# Patient Record
Sex: Male | Born: 1960 | Race: White | Hispanic: No | Marital: Single | Smoking: Never smoker
Health system: Southern US, Community
[De-identification: ages and names within clinical notes are randomized; demographics above are authoritative.]

## PROBLEM LIST (undated history)

## (undated) DIAGNOSIS — F32A Depression, unspecified: Secondary | ICD-10-CM

## (undated) DIAGNOSIS — S069X9A Unspecified intracranial injury with loss of consciousness of unspecified duration, initial encounter: Secondary | ICD-10-CM

## (undated) DIAGNOSIS — F0390 Unspecified dementia without behavioral disturbance: Secondary | ICD-10-CM

## (undated) DIAGNOSIS — F329 Major depressive disorder, single episode, unspecified: Secondary | ICD-10-CM

## (undated) DIAGNOSIS — S069XAA Unspecified intracranial injury with loss of consciousness status unknown, initial encounter: Secondary | ICD-10-CM

---

## 2006-08-08 ENCOUNTER — Emergency Department: Payer: Self-pay | Admitting: Emergency Medicine

## 2007-06-21 ENCOUNTER — Emergency Department: Payer: Self-pay | Admitting: Emergency Medicine

## 2007-11-22 ENCOUNTER — Observation Stay: Payer: Self-pay | Admitting: Unknown Physician Specialty

## 2008-11-12 ENCOUNTER — Emergency Department: Payer: Self-pay | Admitting: Emergency Medicine

## 2008-12-12 ENCOUNTER — Emergency Department: Payer: Self-pay | Admitting: Emergency Medicine

## 2009-03-26 ENCOUNTER — Emergency Department: Payer: Self-pay | Admitting: Emergency Medicine

## 2009-05-03 ENCOUNTER — Emergency Department: Payer: Self-pay | Admitting: Emergency Medicine

## 2010-01-04 ENCOUNTER — Emergency Department: Payer: Self-pay | Admitting: Emergency Medicine

## 2010-01-06 ENCOUNTER — Emergency Department: Payer: Self-pay | Admitting: Emergency Medicine

## 2010-03-23 ENCOUNTER — Inpatient Hospital Stay: Payer: Self-pay | Admitting: Unknown Physician Specialty

## 2010-05-22 ENCOUNTER — Emergency Department: Payer: Self-pay | Admitting: Emergency Medicine

## 2010-06-12 ENCOUNTER — Inpatient Hospital Stay: Payer: Self-pay | Admitting: Psychiatry

## 2010-07-22 ENCOUNTER — Emergency Department: Payer: Self-pay | Admitting: Emergency Medicine

## 2011-03-15 ENCOUNTER — Ambulatory Visit: Payer: Self-pay | Admitting: Family Medicine

## 2011-04-22 ENCOUNTER — Emergency Department (HOSPITAL_COMMUNITY)
Admission: EM | Admit: 2011-04-22 | Discharge: 2011-04-23 | Disposition: A | Discharge status: Home / Self Care | Payer: Medicaid Other | Attending: Emergency Medicine | Admitting: Emergency Medicine

## 2011-04-22 DIAGNOSIS — T490X1A Poisoning by local antifungal, anti-infective and anti-inflammatory drugs, accidental (unintentional), initial encounter: Secondary | ICD-10-CM | POA: Insufficient documentation

## 2011-04-22 DIAGNOSIS — Y9210 Unspecified residential institution as the place of occurrence of the external cause: Secondary | ICD-10-CM | POA: Insufficient documentation

## 2011-04-22 DIAGNOSIS — Z79899 Other long term (current) drug therapy: Secondary | ICD-10-CM | POA: Insufficient documentation

## 2011-04-22 DIAGNOSIS — T495X1A Poisoning by ophthalmological drugs and preparations, accidental (unintentional), initial encounter: Secondary | ICD-10-CM | POA: Insufficient documentation

## 2011-04-23 LAB — DIFFERENTIAL
Basophils Absolute: 0 10*3/uL (ref 0.0–0.1)
Basophils Relative: 0 % (ref 0–1)
Eosinophils Absolute: 0.1 10*3/uL (ref 0.0–0.7)
Eosinophils Relative: 1 % (ref 0–5)
Lymphocytes Relative: 59 % — ABNORMAL HIGH (ref 12–46)
Monocytes Absolute: 0.6 10*3/uL (ref 0.1–1.0)

## 2011-04-23 LAB — BASIC METABOLIC PANEL
BUN: 7 mg/dL (ref 6–23)
CO2: 28 mEq/L (ref 19–32)
Calcium: 8.8 mg/dL (ref 8.4–10.5)
Chloride: 105 mEq/L (ref 96–112)
Creatinine, Ser: 0.65 mg/dL (ref 0.4–1.5)
GFR calc Af Amer: >60 mL/min (ref >60)
GFR calc non Af Amer: >60 mL/min (ref >60)
Glucose, Bld: 88 mg/dL (ref 70–99)
Potassium: 3.4 mEq/L — ABNORMAL LOW (ref 3.5–5.1)
Sodium: 137 mEq/L (ref 135–145)

## 2011-04-23 LAB — CBC
HCT: 32.3 % — ABNORMAL LOW (ref 39.0–52.0)
MCHC: 34.4 g/dL (ref 30.0–36.0)
Platelets: 169 10*3/uL (ref 150–400)
RDW: 13.5 % (ref 11.5–15.5)
WBC: 7.1 10*3/uL (ref 4.0–10.5)

## 2011-04-23 LAB — ETHANOL: Alcohol, Ethyl (B): <5 mg/dL (ref 0–10)

## 2011-12-28 ENCOUNTER — Ambulatory Visit: Payer: Self-pay | Admitting: Internal Medicine

## 2011-12-30 ENCOUNTER — Inpatient Hospital Stay: Payer: Self-pay | Admitting: Internal Medicine

## 2011-12-30 LAB — COMPREHENSIVE METABOLIC PANEL
Albumin: 3.5 g/dL (ref 3.4–5.0)
Alkaline Phosphatase: 115 U/L (ref 50–136)
BUN: 35 mg/dL — ABNORMAL HIGH (ref 7–18)
Bilirubin,Total: 0.4 mg/dL (ref 0.2–1.0)
Creatinine: 0.86 mg/dL (ref 0.60–1.30)
Glucose: 108 mg/dL — ABNORMAL HIGH (ref 65–99)
Osmolality: 308 (ref 275–301)
SGPT (ALT): 28 U/L
Sodium: 151 mmol/L — ABNORMAL HIGH (ref 136–145)

## 2011-12-30 LAB — LIPASE, BLOOD: Lipase: 98 U/L (ref 73–393)

## 2011-12-30 LAB — CBC
HCT: 40.6 % (ref 40.0–52.0)
Platelet: 114 10*3/uL — ABNORMAL LOW (ref 150–440)
RDW: 15.8 % — ABNORMAL HIGH (ref 11.5–14.5)
WBC: 5.1 10*3/uL (ref 3.8–10.6)

## 2011-12-31 LAB — CBC WITH DIFFERENTIAL/PLATELET
Basophil #: 0 10*3/uL (ref 0.0–0.1)
Eosinophil #: 0 10*3/uL (ref 0.0–0.7)
HCT: 39.4 % — ABNORMAL LOW (ref 40.0–52.0)
HGB: 13 g/dL (ref 13.0–18.0)
Lymphocyte %: 23.6 %
MCHC: 33 g/dL (ref 32.0–36.0)
Monocyte %: 8.6 %
Neutrophil #: 3.7 10*3/uL (ref 1.4–6.5)
Neutrophil %: 67.1 %
RDW: 15.5 % — ABNORMAL HIGH (ref 11.5–14.5)
WBC: 5.6 10*3/uL (ref 3.8–10.6)

## 2011-12-31 LAB — FOLATE: Folic Acid: 11.6 ng/mL (ref 3.1–100.0)

## 2011-12-31 LAB — BASIC METABOLIC PANEL
Creatinine: 0.73 mg/dL (ref 0.60–1.30)
EGFR (African American): >60
EGFR (Non-African Amer.): >60
Glucose: 77 mg/dL (ref 65–99)
Sodium: 149 mmol/L — ABNORMAL HIGH (ref 136–145)

## 2011-12-31 LAB — WBCS, STOOL

## 2011-12-31 LAB — OCCULT BLOOD X 1 CARD TO LAB, STOOL: Occult Blood, Feces: NEGATIVE

## 2012-01-01 LAB — CBC WITH DIFFERENTIAL/PLATELET
Basophil #: 0 10*3/uL (ref 0.0–0.1)
Eosinophil #: 0 10*3/uL (ref 0.0–0.7)
HCT: 34.7 % — ABNORMAL LOW (ref 40.0–52.0)
Lymphocyte #: 1.5 10*3/uL (ref 1.0–3.6)
Lymphocyte %: 26.8 %
MCHC: 33.4 g/dL (ref 32.0–36.0)
Monocyte %: 7.6 %
Neutrophil #: 3.6 10*3/uL (ref 1.4–6.5)
Platelet: 114 10*3/uL — ABNORMAL LOW (ref 150–440)
RDW: 15.5 % — ABNORMAL HIGH (ref 11.5–14.5)
WBC: 5.6 10*3/uL (ref 3.8–10.6)

## 2012-01-01 LAB — BASIC METABOLIC PANEL
Anion Gap: 9 (ref 7–16)
BUN: 22 mg/dL — ABNORMAL HIGH (ref 7–18)
Calcium, Total: 8.6 mg/dL (ref 8.5–10.1)
Co2: 31 mmol/L (ref 21–32)
Creatinine: 0.77 mg/dL (ref 0.60–1.30)
EGFR (African American): >60
Glucose: 94 mg/dL (ref 65–99)
Osmolality: 294 (ref 275–301)

## 2012-01-02 LAB — CBC WITH DIFFERENTIAL/PLATELET
Basophil #: 0 10*3/uL (ref 0.0–0.1)
Eosinophil #: 0 10*3/uL (ref 0.0–0.7)
HGB: 11.5 g/dL — ABNORMAL LOW (ref 13.0–18.0)
Lymphocyte #: 1.9 10*3/uL (ref 1.0–3.6)
Lymphocyte %: 53.8 %
MCV: 90 fL (ref 80–100)
Monocyte %: 6 %
Platelet: 105 10*3/uL — ABNORMAL LOW (ref 150–440)
RBC: 3.78 10*6/uL — ABNORMAL LOW (ref 4.40–5.90)
RDW: 15.3 % — ABNORMAL HIGH (ref 11.5–14.5)
WBC: 3.6 10*3/uL — ABNORMAL LOW (ref 3.8–10.6)

## 2012-01-02 LAB — BASIC METABOLIC PANEL
Anion Gap: 9 (ref 7–16)
Calcium, Total: 8.6 mg/dL (ref 8.5–10.1)
Co2: 32 mmol/L (ref 21–32)
EGFR (African American): >60
Glucose: 79 mg/dL (ref 65–99)
Osmolality: 282 (ref 275–301)
Sodium: 141 mmol/L (ref 136–145)

## 2012-01-02 LAB — STOOL CULTURE

## 2012-01-03 LAB — CBC WITH DIFFERENTIAL/PLATELET
Basophil %: 0.3 %
Eosinophil #: 0 10*3/uL (ref 0.0–0.7)
Lymphocyte #: 1.5 10*3/uL (ref 1.0–3.6)
MCH: 30.5 pg (ref 26.0–34.0)
MCHC: 33.8 g/dL (ref 32.0–36.0)
MCV: 90 fL (ref 80–100)
Monocyte #: 0.3 10*3/uL (ref 0.0–0.7)
Neutrophil #: 4 10*3/uL (ref 1.4–6.5)
Neutrophil %: 68.6 %
RBC: 4.09 10*6/uL — ABNORMAL LOW (ref 4.40–5.90)

## 2012-01-03 LAB — URINALYSIS, COMPLETE
Ketone: NEGATIVE
Ph: 7 (ref 4.5–8.0)
Protein: NEGATIVE
Specific Gravity: 1.005 (ref 1.003–1.030)
WBC UR: NONE SEEN /HPF (ref 0–5)

## 2012-01-03 LAB — BASIC METABOLIC PANEL
Chloride: 99 mmol/L (ref 98–107)
Co2: 30 mmol/L (ref 21–32)
Creatinine: 0.76 mg/dL (ref 0.60–1.30)
EGFR (African American): >60
Sodium: 135 mmol/L — ABNORMAL LOW (ref 136–145)

## 2012-01-04 LAB — CBC WITH DIFFERENTIAL/PLATELET
Basophil #: 0 10*3/uL (ref 0.0–0.1)
Basophil %: 0.3 %
Eosinophil %: 0.4 %
HCT: 38.2 % — ABNORMAL LOW (ref 40.0–52.0)
Lymphocyte #: 1.9 10*3/uL (ref 1.0–3.6)
Lymphocyte %: 37 %
Monocyte #: 0.4 10*3/uL (ref 0.0–0.7)
Neutrophil #: 2.9 10*3/uL (ref 1.4–6.5)
Neutrophil %: 54.6 %
RDW: 15.5 % — ABNORMAL HIGH (ref 11.5–14.5)
WBC: 5.3 10*3/uL (ref 3.8–10.6)

## 2012-01-04 LAB — MAGNESIUM: Magnesium: 1.8 mg/dL

## 2012-01-04 LAB — BASIC METABOLIC PANEL
Calcium, Total: 8.5 mg/dL (ref 8.5–10.1)
Co2: 28 mmol/L (ref 21–32)
Creatinine: 0.58 mg/dL — ABNORMAL LOW (ref 0.60–1.30)
EGFR (African American): >60
Potassium: 4.5 mmol/L (ref 3.5–5.1)
Sodium: 139 mmol/L (ref 136–145)

## 2012-01-05 LAB — CBC WITH DIFFERENTIAL/PLATELET
Basophil #: 0 10*3/uL (ref 0.0–0.1)
Eosinophil %: 1 %
HGB: 13.9 g/dL (ref 13.0–18.0)
Lymphocyte %: 24.3 %
Monocyte %: 5.5 %
Neutrophil %: 68.9 %
Platelet: 152 10*3/uL (ref 150–440)
RBC: 4.58 10*6/uL (ref 4.40–5.90)
WBC: 11.6 10*3/uL — ABNORMAL HIGH (ref 3.8–10.6)

## 2012-01-05 LAB — BASIC METABOLIC PANEL
Calcium, Total: 8.9 mg/dL (ref 8.5–10.1)
Co2: 28 mmol/L (ref 21–32)
Creatinine: 0.72 mg/dL (ref 0.60–1.30)
EGFR (African American): >60
Potassium: 4.3 mmol/L (ref 3.5–5.1)
Sodium: 138 mmol/L (ref 136–145)

## 2012-01-06 LAB — CBC WITH DIFFERENTIAL/PLATELET
Basophil #: 0 10*3/uL (ref 0.0–0.1)
Eosinophil %: 1.1 %
HCT: 36.4 % — ABNORMAL LOW (ref 40.0–52.0)
HGB: 12 g/dL — ABNORMAL LOW (ref 13.0–18.0)
Lymphocyte #: 2.1 10*3/uL (ref 1.0–3.6)
Lymphocyte %: 22.2 %
MCH: 30.3 pg (ref 26.0–34.0)
MCHC: 33 g/dL (ref 32.0–36.0)
Monocyte %: 6.1 %
RBC: 3.97 10*6/uL — ABNORMAL LOW (ref 4.40–5.90)

## 2012-01-06 LAB — BASIC METABOLIC PANEL
BUN: 17 mg/dL (ref 7–18)
Chloride: 101 mmol/L (ref 98–107)
Osmolality: 280 (ref 275–301)
Potassium: 4.3 mmol/L (ref 3.5–5.1)

## 2012-01-07 LAB — CBC WITH DIFFERENTIAL/PLATELET
Basophil %: 0.3 %
Eosinophil %: 1.2 %
HGB: 11.9 g/dL — ABNORMAL LOW (ref 13.0–18.0)
Lymphocyte #: 2 10*3/uL (ref 1.0–3.6)
MCH: 30.3 pg (ref 26.0–34.0)
MCHC: 33.3 g/dL (ref 32.0–36.0)
MCV: 91 fL (ref 80–100)
Neutrophil #: 9.1 10*3/uL — ABNORMAL HIGH (ref 1.4–6.5)
Neutrophil %: 76 %

## 2012-01-07 LAB — BASIC METABOLIC PANEL
BUN: 16 mg/dL (ref 7–18)
Calcium, Total: 8.9 mg/dL (ref 8.5–10.1)
Creatinine: 0.76 mg/dL (ref 0.60–1.30)
EGFR (African American): >60
EGFR (Non-African Amer.): >60
Glucose: 93 mg/dL (ref 65–99)
Potassium: 4.4 mmol/L (ref 3.5–5.1)
Sodium: 141 mmol/L (ref 136–145)

## 2012-01-08 LAB — CBC WITH DIFFERENTIAL/PLATELET
Basophil %: 0.9 %
Eosinophil %: 3.5 %
HGB: 10.9 g/dL — ABNORMAL LOW (ref 13.0–18.0)
Lymphocyte #: 2 10*3/uL (ref 1.0–3.6)
Lymphocyte %: 39.9 %
MCHC: 33.3 g/dL (ref 32.0–36.0)
Monocyte %: 8.4 %
Neutrophil #: 2.4 10*3/uL (ref 1.4–6.5)
Neutrophil %: 47.3 %
Platelet: 162 10*3/uL (ref 150–440)
RBC: 3.58 10*6/uL — ABNORMAL LOW (ref 4.40–5.90)

## 2012-01-08 LAB — BASIC METABOLIC PANEL
Calcium, Total: 8.5 mg/dL (ref 8.5–10.1)
Co2: 26 mmol/L (ref 21–32)
Creatinine: 0.75 mg/dL (ref 0.60–1.30)
Potassium: 4.2 mmol/L (ref 3.5–5.1)
Sodium: 137 mmol/L (ref 136–145)

## 2012-01-09 LAB — BASIC METABOLIC PANEL
Anion Gap: 9 (ref 7–16)
Calcium, Total: 8.3 mg/dL — ABNORMAL LOW (ref 8.5–10.1)
Chloride: 103 mmol/L (ref 98–107)
Co2: 28 mmol/L (ref 21–32)
EGFR (African American): >60
EGFR (Non-African Amer.): >60
Osmolality: 277 (ref 275–301)
Potassium: 4.5 mmol/L (ref 3.5–5.1)

## 2012-01-09 LAB — CBC WITH DIFFERENTIAL/PLATELET
Basophil #: 0 10*3/uL (ref 0.0–0.1)
Eosinophil #: 0.2 10*3/uL (ref 0.0–0.7)
Lymphocyte %: 51 %
MCHC: 33.1 g/dL (ref 32.0–36.0)
Monocyte %: 8.4 %
Neutrophil %: 35.5 %
Platelet: 180 10*3/uL (ref 150–440)
RBC: 3.42 10*6/uL — ABNORMAL LOW (ref 4.40–5.90)
RDW: 15.8 % — ABNORMAL HIGH (ref 11.5–14.5)
WBC: 4.5 10*3/uL (ref 3.8–10.6)

## 2012-01-11 LAB — BASIC METABOLIC PANEL
Chloride: 103 mmol/L (ref 98–107)
Co2: 28 mmol/L (ref 21–32)
Creatinine: 0.51 mg/dL — ABNORMAL LOW (ref 0.60–1.30)
Sodium: 137 mmol/L (ref 136–145)

## 2012-01-11 LAB — CBC WITH DIFFERENTIAL/PLATELET
Basophil #: 0 10*3/uL (ref 0.0–0.1)
Basophil %: 1.1 %
Eosinophil #: 0.1 10*3/uL (ref 0.0–0.7)
HCT: 32.5 % — ABNORMAL LOW (ref 40.0–52.0)
HGB: 10.8 g/dL — ABNORMAL LOW (ref 13.0–18.0)
Lymphocyte #: 1.6 10*3/uL (ref 1.0–3.6)
Lymphocyte %: 47.5 %
MCHC: 33.1 g/dL (ref 32.0–36.0)
Monocyte #: 0.3 10*3/uL (ref 0.0–0.7)
Neutrophil #: 1.3 10*3/uL — ABNORMAL LOW (ref 1.4–6.5)
Neutrophil %: 40.4 %
RBC: 3.5 10*6/uL — ABNORMAL LOW (ref 4.40–5.90)
WBC: 3.3 10*3/uL — ABNORMAL LOW (ref 3.8–10.6)

## 2012-01-12 LAB — URINALYSIS, COMPLETE
Bacteria: NONE SEEN
Bilirubin,UR: NEGATIVE
Glucose,UR: NEGATIVE mg/dL (ref 0–75)
Ph: 6 (ref 4.5–8.0)
Specific Gravity: 1.003 (ref 1.003–1.030)
Squamous Epithelial: NONE SEEN

## 2012-01-12 LAB — BASIC METABOLIC PANEL
BUN: 7 mg/dL (ref 7–18)
Calcium, Total: 8.4 mg/dL — ABNORMAL LOW (ref 8.5–10.1)
Chloride: 106 mmol/L (ref 98–107)
Osmolality: 286 (ref 275–301)
Potassium: 4.7 mmol/L (ref 3.5–5.1)
Sodium: 145 mmol/L (ref 136–145)

## 2012-01-12 LAB — CBC WITH DIFFERENTIAL/PLATELET
Basophil #: 0 10*3/uL (ref 0.0–0.1)
Basophil %: 0.6 %
Eosinophil #: 0 10*3/uL (ref 0.0–0.7)
HCT: 30.9 % — ABNORMAL LOW (ref 40.0–52.0)
MCH: 30.3 pg (ref 26.0–34.0)
MCV: 91 fL (ref 80–100)
Monocyte #: 0.1 10*3/uL (ref 0.0–0.7)
Platelet: 223 10*3/uL (ref 150–440)
RDW: 16.3 % — ABNORMAL HIGH (ref 11.5–14.5)

## 2012-01-12 LAB — TSH: Thyroid Stimulating Horm: 3.6 u[IU]/mL

## 2012-01-13 LAB — COMPREHENSIVE METABOLIC PANEL
Albumin: 2.5 g/dL — ABNORMAL LOW (ref 3.4–5.0)
Alkaline Phosphatase: 65 U/L (ref 50–136)
BUN: 5 mg/dL — ABNORMAL LOW (ref 7–18)
Chloride: 102 mmol/L (ref 98–107)
EGFR (African American): >60
EGFR (Non-African Amer.): >60
Glucose: 119 mg/dL — ABNORMAL HIGH (ref 65–99)
Osmolality: 283 (ref 275–301)
SGOT(AST): 15 U/L (ref 15–37)
SGPT (ALT): 16 U/L
Total Protein: 5.4 g/dL — ABNORMAL LOW (ref 6.4–8.2)

## 2012-01-13 LAB — CBC WITH DIFFERENTIAL/PLATELET
Basophil %: 0.3 %
Eosinophil %: 0.1 %
HCT: 31.7 % — ABNORMAL LOW (ref 40.0–52.0)
HGB: 10.6 g/dL — ABNORMAL LOW (ref 13.0–18.0)
Lymphocyte #: 1.6 10*3/uL (ref 1.0–3.6)
MCV: 92 fL (ref 80–100)
Monocyte %: 5.4 %
Neutrophil #: 8.4 10*3/uL — ABNORMAL HIGH (ref 1.4–6.5)
Platelet: 299 10*3/uL (ref 150–440)
RBC: 3.46 10*6/uL — ABNORMAL LOW (ref 4.40–5.90)
RDW: 16.8 % — ABNORMAL HIGH (ref 11.5–14.5)
WBC: 10.7 10*3/uL — ABNORMAL HIGH (ref 3.8–10.6)

## 2012-01-13 LAB — MAGNESIUM: Magnesium: 1.4 mg/dL — ABNORMAL LOW

## 2012-01-14 LAB — BASIC METABOLIC PANEL
Anion Gap: 9 (ref 7–16)
BUN: 5 mg/dL — ABNORMAL LOW (ref 7–18)
Calcium, Total: 8.5 mg/dL (ref 8.5–10.1)
Chloride: 105 mmol/L (ref 98–107)
Co2: 32 mmol/L (ref 21–32)
Creatinine: 0.44 mg/dL — ABNORMAL LOW (ref 0.60–1.30)
EGFR (African American): >60
EGFR (Non-African Amer.): >60
Glucose: 129 mg/dL — ABNORMAL HIGH (ref 65–99)
Potassium: 3.6 mmol/L (ref 3.5–5.1)

## 2012-01-14 LAB — CBC WITH DIFFERENTIAL/PLATELET
Basophil #: 0 10*3/uL (ref 0.0–0.1)
Basophil %: 0.6 %
Eosinophil #: 0.1 10*3/uL (ref 0.0–0.7)
HCT: 35.4 % — ABNORMAL LOW (ref 40.0–52.0)
HGB: 11.8 g/dL — ABNORMAL LOW (ref 13.0–18.0)
Lymphocyte #: 1.6 10*3/uL (ref 1.0–3.6)
Lymphocyte %: 35.4 %
MCHC: 33.3 g/dL (ref 32.0–36.0)
Monocyte %: 8.1 %
Neutrophil #: 2.5 10*3/uL (ref 1.4–6.5)
Platelet: 259 10*3/uL (ref 150–440)
RDW: 16.8 % — ABNORMAL HIGH (ref 11.5–14.5)

## 2012-01-15 LAB — CBC WITH DIFFERENTIAL/PLATELET
Basophil #: 0.1 10*3/uL (ref 0.0–0.1)
Basophil %: 1.7 %
Eosinophil #: 0 10*3/uL (ref 0.0–0.7)
HCT: 30.8 % — ABNORMAL LOW (ref 40.0–52.0)
Lymphocyte #: 2.4 10*3/uL (ref 1.0–3.6)
Lymphocyte %: 32.6 %
MCH: 30.5 pg (ref 26.0–34.0)
MCV: 92 fL (ref 80–100)
Monocyte #: 0.6 10*3/uL (ref 0.0–0.7)
Neutrophil #: 4.1 10*3/uL (ref 1.4–6.5)
Neutrophil %: 56.7 %
RDW: 17.2 % — ABNORMAL HIGH (ref 11.5–14.5)
WBC: 7.3 10*3/uL (ref 3.8–10.6)

## 2012-01-15 LAB — BASIC METABOLIC PANEL
BUN: 6 mg/dL — ABNORMAL LOW (ref 7–18)
Chloride: 99 mmol/L (ref 98–107)
Creatinine: 0.61 mg/dL (ref 0.60–1.30)
EGFR (African American): >60
EGFR (Non-African Amer.): >60
Glucose: 126 mg/dL — ABNORMAL HIGH (ref 65–99)
Sodium: 140 mmol/L (ref 136–145)

## 2012-01-16 LAB — BASIC METABOLIC PANEL
Anion Gap: 8 (ref 7–16)
BUN: 6 mg/dL — ABNORMAL LOW (ref 7–18)
Chloride: 103 mmol/L (ref 98–107)
Co2: 32 mmol/L (ref 21–32)
Creatinine: 0.52 mg/dL — ABNORMAL LOW (ref 0.60–1.30)
EGFR (African American): >60
Potassium: 3.9 mmol/L (ref 3.5–5.1)
Sodium: 143 mmol/L (ref 136–145)

## 2012-01-16 LAB — CBC WITH DIFFERENTIAL/PLATELET
Basophil #: 0 10*3/uL (ref 0.0–0.1)
HCT: 29.9 % — ABNORMAL LOW (ref 40.0–52.0)
Lymphocyte #: 2.2 10*3/uL (ref 1.0–3.6)
Lymphocyte %: 50.1 %
Monocyte %: 10.4 %
Neutrophil %: 38.5 %
Platelet: 217 10*3/uL (ref 150–440)
RBC: 3.25 10*6/uL — ABNORMAL LOW (ref 4.40–5.90)
RDW: 16.4 % — ABNORMAL HIGH (ref 11.5–14.5)
WBC: 4.3 10*3/uL (ref 3.8–10.6)

## 2012-01-17 LAB — CULTURE, BLOOD (SINGLE)

## 2012-01-17 LAB — MAGNESIUM: Magnesium: 1.8 mg/dL

## 2012-01-18 LAB — CBC WITH DIFFERENTIAL/PLATELET
Eosinophil #: 0 10*3/uL (ref 0.0–0.7)
Eosinophil %: 1.1 %
Lymphocyte #: 2.3 10*3/uL (ref 1.0–3.6)
Lymphocyte %: 58.4 %
MCH: 30.5 pg (ref 26.0–34.0)
MCHC: 33.1 g/dL (ref 32.0–36.0)
Neutrophil %: 33.1 %
Platelet: 258 10*3/uL (ref 150–440)
RBC: 3.9 10*6/uL — ABNORMAL LOW (ref 4.40–5.90)
RDW: 16.6 % — ABNORMAL HIGH (ref 11.5–14.5)

## 2012-01-18 LAB — BASIC METABOLIC PANEL
BUN: 4 mg/dL — ABNORMAL LOW (ref 7–18)
Chloride: 100 mmol/L (ref 98–107)
Creatinine: 0.47 mg/dL — ABNORMAL LOW (ref 0.60–1.30)
Potassium: 4 mmol/L (ref 3.5–5.1)
Sodium: 140 mmol/L (ref 136–145)

## 2012-01-19 LAB — CBC WITH DIFFERENTIAL/PLATELET
Basophil %: 0.7 %
Eosinophil %: 1.1 %
HCT: 34.6 % — ABNORMAL LOW (ref 40.0–52.0)
HGB: 11.6 g/dL — ABNORMAL LOW (ref 13.0–18.0)
Lymphocyte #: 2.5 10*3/uL (ref 1.0–3.6)
Lymphocyte %: 55.4 %
MCH: 30.8 pg (ref 26.0–34.0)
MCHC: 33.5 g/dL (ref 32.0–36.0)
Monocyte #: 0.3 10*3/uL (ref 0.0–0.7)
Neutrophil #: 1.7 10*3/uL (ref 1.4–6.5)
RBC: 3.77 10*6/uL — ABNORMAL LOW (ref 4.40–5.90)
RDW: 16.6 % — ABNORMAL HIGH (ref 11.5–14.5)
WBC: 4.6 10*3/uL (ref 3.8–10.6)

## 2012-01-19 LAB — BASIC METABOLIC PANEL
Anion Gap: 10 (ref 7–16)
Calcium, Total: 8.4 mg/dL — ABNORMAL LOW (ref 8.5–10.1)
Chloride: 103 mmol/L (ref 98–107)
Co2: 28 mmol/L (ref 21–32)
Creatinine: 0.48 mg/dL — ABNORMAL LOW (ref 0.60–1.30)
EGFR (Non-African Amer.): >60
Glucose: 113 mg/dL — ABNORMAL HIGH (ref 65–99)
Osmolality: 279 (ref 275–301)
Sodium: 141 mmol/L (ref 136–145)

## 2012-01-19 LAB — PROTIME-INR
INR: 1
Prothrombin Time: 13.8 secs (ref 11.5–14.7)

## 2012-01-19 LAB — APTT: Activated PTT: 34.3 secs (ref 23.6–35.9)

## 2012-01-20 LAB — CBC WITH DIFFERENTIAL/PLATELET
Basophil #: 0 10*3/uL (ref 0.0–0.1)
HGB: 12.1 g/dL — ABNORMAL LOW (ref 13.0–18.0)
Lymphocyte %: 40.1 %
MCHC: 33.7 g/dL (ref 32.0–36.0)
Monocyte #: 0.4 10*3/uL (ref 0.0–0.7)
Monocyte %: 6.1 %
Neutrophil #: 3.6 10*3/uL (ref 1.4–6.5)
Neutrophil %: 52.4 %
Platelet: 247 10*3/uL (ref 150–440)
RDW: 16.3 % — ABNORMAL HIGH (ref 11.5–14.5)
WBC: 6.8 10*3/uL (ref 3.8–10.6)

## 2012-01-20 LAB — BASIC METABOLIC PANEL
Anion Gap: 9 (ref 7–16)
BUN: 6 mg/dL — ABNORMAL LOW (ref 7–18)
EGFR (African American): >60
EGFR (Non-African Amer.): >60
Glucose: 100 mg/dL — ABNORMAL HIGH (ref 65–99)
Osmolality: 279 (ref 275–301)
Potassium: 3.8 mmol/L (ref 3.5–5.1)
Sodium: 141 mmol/L (ref 136–145)

## 2012-01-21 LAB — CBC WITH DIFFERENTIAL/PLATELET
Basophil %: 0.5 %
Eosinophil #: 0.1 10*3/uL (ref 0.0–0.7)
HCT: 32 % — ABNORMAL LOW (ref 40.0–52.0)
HGB: 10.7 g/dL — ABNORMAL LOW (ref 13.0–18.0)
Lymphocyte #: 2.8 10*3/uL (ref 1.0–3.6)
Lymphocyte %: 41 %
Monocyte #: 0.4 10*3/uL (ref 0.0–0.7)
Neutrophil #: 3.5 10*3/uL (ref 1.4–6.5)
WBC: 6.9 10*3/uL (ref 3.8–10.6)

## 2012-01-21 LAB — BASIC METABOLIC PANEL
Anion Gap: 10 (ref 7–16)
BUN: 9 mg/dL (ref 7–18)
Calcium, Total: 8.4 mg/dL — ABNORMAL LOW (ref 8.5–10.1)
Chloride: 106 mmol/L (ref 98–107)
Co2: 27 mmol/L (ref 21–32)
Creatinine: 0.6 mg/dL (ref 0.60–1.30)
EGFR (African American): >60
EGFR (Non-African Amer.): >60
Osmolality: 282 (ref 275–301)

## 2012-01-22 LAB — BASIC METABOLIC PANEL
Anion Gap: 12 (ref 7–16)
Chloride: 104 mmol/L (ref 98–107)
Co2: 27 mmol/L (ref 21–32)
Creatinine: 0.56 mg/dL — ABNORMAL LOW (ref 0.60–1.30)
EGFR (African American): >60
EGFR (Non-African Amer.): >60
Glucose: 131 mg/dL — ABNORMAL HIGH (ref 65–99)
Osmolality: 285 (ref 275–301)

## 2012-01-22 LAB — CBC WITH DIFFERENTIAL/PLATELET
Basophil #: 0 10*3/uL (ref 0.0–0.1)
Eosinophil %: 0 %
HGB: 12.2 g/dL — ABNORMAL LOW (ref 13.0–18.0)
Lymphocyte #: 1.5 10*3/uL (ref 1.0–3.6)
Lymphocyte %: 31.4 %
MCH: 30.8 pg (ref 26.0–34.0)
MCV: 92 fL (ref 80–100)
Monocyte #: 0.2 10*3/uL (ref 0.0–0.7)
Monocyte %: 3.4 %
Neutrophil #: 3.2 10*3/uL (ref 1.4–6.5)
Platelet: 233 10*3/uL (ref 150–440)
RBC: 3.95 10*6/uL — ABNORMAL LOW (ref 4.40–5.90)
RDW: 16.9 % — ABNORMAL HIGH (ref 11.5–14.5)
WBC: 4.9 10*3/uL (ref 3.8–10.6)

## 2012-01-22 LAB — PROTIME-INR: Prothrombin Time: 13.8 secs (ref 11.5–14.7)

## 2012-01-22 LAB — APTT: Activated PTT: 32.7 secs (ref 23.6–35.9)

## 2012-01-23 LAB — CBC WITH DIFFERENTIAL/PLATELET
Basophil #: 0 10*3/uL (ref 0.0–0.1)
Basophil %: 0.8 %
Eosinophil #: 0.1 10*3/uL (ref 0.0–0.7)
HCT: 31.4 % — ABNORMAL LOW (ref 40.0–52.0)
HGB: 10.4 g/dL — ABNORMAL LOW (ref 13.0–18.0)
Lymphocyte %: 49.2 %
MCHC: 33.3 g/dL (ref 32.0–36.0)
MCV: 92 fL (ref 80–100)
Monocyte #: 0.4 10*3/uL (ref 0.0–0.7)
Monocyte %: 6.6 %
Neutrophil #: 2.8 10*3/uL (ref 1.4–6.5)
WBC: 6.5 10*3/uL (ref 3.8–10.6)

## 2012-01-23 LAB — BASIC METABOLIC PANEL
Anion Gap: 9 (ref 7–16)
BUN: 7 mg/dL (ref 7–18)
Calcium, Total: 8.2 mg/dL — ABNORMAL LOW (ref 8.5–10.1)
Co2: 31 mmol/L (ref 21–32)
EGFR (Non-African Amer.): >60
Glucose: 74 mg/dL (ref 65–99)
Osmolality: 289 (ref 275–301)
Potassium: 3.3 mmol/L — ABNORMAL LOW (ref 3.5–5.1)
Sodium: 147 mmol/L — ABNORMAL HIGH (ref 136–145)

## 2012-01-23 LAB — POTASSIUM: Potassium: 4.4 mmol/L (ref 3.5–5.1)

## 2012-01-24 LAB — BASIC METABOLIC PANEL
Anion Gap: 10 (ref 7–16)
Calcium, Total: 8.2 mg/dL — ABNORMAL LOW (ref 8.5–10.1)
Co2: 28 mmol/L (ref 21–32)
EGFR (African American): >60
Potassium: 3.8 mmol/L (ref 3.5–5.1)

## 2012-01-24 LAB — CBC WITH DIFFERENTIAL/PLATELET
Basophil #: 0 10*3/uL (ref 0.0–0.1)
Basophil %: 0.2 %
HGB: 10.3 g/dL — ABNORMAL LOW (ref 13.0–18.0)
Lymphocyte #: 1.1 10*3/uL (ref 1.0–3.6)
MCV: 94 fL (ref 80–100)
Monocyte %: 1.7 %
Neutrophil %: 78.3 %
Platelet: 161 10*3/uL (ref 150–440)
RDW: 17.2 % — ABNORMAL HIGH (ref 11.5–14.5)

## 2012-01-24 LAB — HEPATIC FUNCTION PANEL A (ARMC)
Albumin: 2.5 g/dL — ABNORMAL LOW (ref 3.4–5.0)
Alkaline Phosphatase: 75 U/L (ref 50–136)
Bilirubin, Direct: <0.1 mg/dL (ref 0.00–0.20)
Bilirubin,Total: 0.2 mg/dL (ref 0.2–1.0)

## 2012-01-25 LAB — BASIC METABOLIC PANEL
Anion Gap: 10 (ref 7–16)
BUN: 13 mg/dL (ref 7–18)
Chloride: 108 mmol/L — ABNORMAL HIGH (ref 98–107)
Creatinine: 0.57 mg/dL — ABNORMAL LOW (ref 0.60–1.30)
EGFR (African American): >60
EGFR (Non-African Amer.): >60
Glucose: 70 mg/dL (ref 65–99)
Osmolality: 287 (ref 275–301)
Sodium: 145 mmol/L (ref 136–145)

## 2012-01-25 LAB — CBC WITH DIFFERENTIAL/PLATELET
Basophil #: 0 10*3/uL (ref 0.0–0.1)
Basophil %: 0.5 %
Eosinophil #: 0 10*3/uL (ref 0.0–0.7)
Eosinophil %: 0.4 %
HCT: 28.1 % — ABNORMAL LOW (ref 40.0–52.0)
HGB: 9.4 g/dL — ABNORMAL LOW (ref 13.0–18.0)
Lymphocyte #: 2.8 10*3/uL (ref 1.0–3.6)
Lymphocyte %: 48 %
MCH: 31 pg (ref 26.0–34.0)
Monocyte #: 0.3 10*3/uL (ref 0.0–0.7)
Neutrophil %: 46.5 %
Platelet: 143 10*3/uL — ABNORMAL LOW (ref 150–440)
RBC: 3.04 10*6/uL — ABNORMAL LOW (ref 4.40–5.90)

## 2012-01-26 LAB — BASIC METABOLIC PANEL
BUN: 7 mg/dL (ref 7–18)
Calcium, Total: 8.1 mg/dL — ABNORMAL LOW (ref 8.5–10.1)
Co2: 27 mmol/L (ref 21–32)
Creatinine: 0.51 mg/dL — ABNORMAL LOW (ref 0.60–1.30)
EGFR (African American): >60
EGFR (Non-African Amer.): >60
Potassium: 3.8 mmol/L (ref 3.5–5.1)
Sodium: 143 mmol/L (ref 136–145)

## 2012-01-26 LAB — CBC WITH DIFFERENTIAL/PLATELET
Eosinophil #: 0 10*3/uL (ref 0.0–0.7)
Eosinophil %: 1 %
HCT: 30.8 % — ABNORMAL LOW (ref 40.0–52.0)
Lymphocyte #: 2.7 10*3/uL (ref 1.0–3.6)
MCH: 30.8 pg (ref 26.0–34.0)
MCHC: 33 g/dL (ref 32.0–36.0)
MCV: 93 fL (ref 80–100)
Monocyte #: 0.3 10*3/uL (ref 0.0–0.7)
Monocyte %: 5 %
Neutrophil #: 2 10*3/uL (ref 1.4–6.5)
Platelet: 128 10*3/uL — ABNORMAL LOW (ref 150–440)
RBC: 3.3 10*6/uL — ABNORMAL LOW (ref 4.40–5.90)
RDW: 17.8 % — ABNORMAL HIGH (ref 11.5–14.5)
WBC: 5.1 10*3/uL (ref 3.8–10.6)

## 2012-01-26 LAB — MAGNESIUM: Magnesium: 1.8 mg/dL

## 2012-01-27 LAB — CBC WITH DIFFERENTIAL/PLATELET
Basophil %: 0.6 %
Eosinophil #: 0.1 10*3/uL (ref 0.0–0.7)
HGB: 10 g/dL — ABNORMAL LOW (ref 13.0–18.0)
Lymphocyte #: 3.2 10*3/uL (ref 1.0–3.6)
Lymphocyte %: 55.4 %
MCH: 30.9 pg (ref 26.0–34.0)
Monocyte #: 0.4 10*3/uL (ref 0.0–0.7)
Neutrophil #: 2.1 10*3/uL (ref 1.4–6.5)
RBC: 3.25 10*6/uL — ABNORMAL LOW (ref 4.40–5.90)
WBC: 5.8 10*3/uL (ref 3.8–10.6)

## 2012-01-27 LAB — BASIC METABOLIC PANEL
Anion Gap: 7 (ref 7–16)
Calcium, Total: 8.4 mg/dL — ABNORMAL LOW (ref 8.5–10.1)
Co2: 28 mmol/L (ref 21–32)
EGFR (African American): >60
Glucose: 76 mg/dL (ref 65–99)
Osmolality: 277 (ref 275–301)
Potassium: 3.7 mmol/L (ref 3.5–5.1)

## 2012-01-28 ENCOUNTER — Ambulatory Visit: Payer: Self-pay | Admitting: Internal Medicine

## 2012-01-28 LAB — BASIC METABOLIC PANEL
Anion Gap: 6 — ABNORMAL LOW (ref 7–16)
BUN: 9 mg/dL (ref 7–18)
Calcium, Total: 8.9 mg/dL (ref 8.5–10.1)
Chloride: 104 mmol/L (ref 98–107)
Creatinine: 0.73 mg/dL (ref 0.60–1.30)
EGFR (African American): >60
EGFR (Non-African Amer.): >60
Glucose: 74 mg/dL (ref 65–99)
Osmolality: 277 (ref 275–301)

## 2012-01-28 LAB — CBC WITH DIFFERENTIAL/PLATELET
Basophil %: 0.7 %
Eosinophil %: 1.3 %
HCT: 35.5 % — ABNORMAL LOW (ref 40.0–52.0)
HGB: 11.7 g/dL — ABNORMAL LOW (ref 13.0–18.0)
Lymphocyte #: 2.9 10*3/uL (ref 1.0–3.6)
Lymphocyte %: 50.7 %
MCH: 30.9 pg (ref 26.0–34.0)
MCHC: 33 g/dL (ref 32.0–36.0)
MCV: 94 fL (ref 80–100)
Neutrophil #: 2.4 10*3/uL (ref 1.4–6.5)
Neutrophil %: 41.1 %
Platelet: 172 10*3/uL (ref 150–440)
RBC: 3.79 10*6/uL — ABNORMAL LOW (ref 4.40–5.90)

## 2012-01-29 LAB — CBC WITH DIFFERENTIAL/PLATELET
Basophil #: 0 10*3/uL (ref 0.0–0.1)
Basophil %: 0.4 %
Eosinophil #: 0.1 10*3/uL (ref 0.0–0.7)
HGB: 10.6 g/dL — ABNORMAL LOW (ref 13.0–18.0)
Lymphocyte #: 3.1 10*3/uL (ref 1.0–3.6)
Lymphocyte %: 56.6 %
MCH: 31.2 pg (ref 26.0–34.0)
MCV: 94 fL (ref 80–100)
Monocyte #: 0.3 10*3/uL (ref 0.0–0.7)
Neutrophil #: 1.9 10*3/uL (ref 1.4–6.5)
Neutrophil %: 35.4 %
RBC: 3.41 10*6/uL — ABNORMAL LOW (ref 4.40–5.90)
RDW: 17.6 % — ABNORMAL HIGH (ref 11.5–14.5)
WBC: 5.4 10*3/uL (ref 3.8–10.6)

## 2012-01-29 LAB — BASIC METABOLIC PANEL
Anion Gap: 9 (ref 7–16)
Calcium, Total: 8.6 mg/dL (ref 8.5–10.1)
EGFR (African American): >60
EGFR (Non-African Amer.): >60
Glucose: 67 mg/dL (ref 65–99)
Osmolality: 281 (ref 275–301)
Potassium: 3.9 mmol/L (ref 3.5–5.1)
Sodium: 142 mmol/L (ref 136–145)

## 2012-01-31 LAB — BASIC METABOLIC PANEL
BUN: 16 mg/dL (ref 7–18)
Calcium, Total: 8.8 mg/dL (ref 8.5–10.1)
EGFR (African American): >60
EGFR (Non-African Amer.): >60
Glucose: 81 mg/dL (ref 65–99)
Osmolality: 289 (ref 275–301)

## 2012-01-31 LAB — CBC WITH DIFFERENTIAL/PLATELET
Basophil %: 0.4 %
Eosinophil %: 0.7 %
HCT: 35 % — ABNORMAL LOW (ref 40.0–52.0)
Lymphocyte #: 3 10*3/uL (ref 1.0–3.6)
Lymphocyte %: 52.8 %
Monocyte %: 7.4 %
Neutrophil #: 2.2 10*3/uL (ref 1.4–6.5)
Platelet: 172 10*3/uL (ref 150–440)
RBC: 3.72 10*6/uL — ABNORMAL LOW (ref 4.40–5.90)
WBC: 5.8 10*3/uL (ref 3.8–10.6)

## 2012-02-01 LAB — CBC WITH DIFFERENTIAL/PLATELET
Basophil %: 0.4 %
Eosinophil #: 0 10*3/uL (ref 0.0–0.7)
Eosinophil %: 0.6 %
HCT: 35.7 % — ABNORMAL LOW (ref 40.0–52.0)
HGB: 11.8 g/dL — ABNORMAL LOW (ref 13.0–18.0)
MCH: 31.1 pg (ref 26.0–34.0)
MCHC: 32.9 g/dL (ref 32.0–36.0)
MCV: 95 fL (ref 80–100)
Monocyte #: 0.5 10*3/uL (ref 0.0–0.7)
Neutrophil #: 2.4 10*3/uL (ref 1.4–6.5)
RBC: 3.78 10*6/uL — ABNORMAL LOW (ref 4.40–5.90)

## 2012-02-01 LAB — BASIC METABOLIC PANEL
Anion Gap: 12 (ref 7–16)
BUN: 18 mg/dL (ref 7–18)
Calcium, Total: 8.8 mg/dL (ref 8.5–10.1)
Chloride: 105 mmol/L (ref 98–107)
Co2: 27 mmol/L (ref 21–32)
Creatinine: 0.71 mg/dL (ref 0.60–1.30)
EGFR (Non-African Amer.): >60
Glucose: 79 mg/dL (ref 65–99)
Sodium: 144 mmol/L (ref 136–145)

## 2012-02-02 LAB — BASIC METABOLIC PANEL
Anion Gap: 10 (ref 7–16)
Chloride: 103 mmol/L (ref 98–107)
Co2: 30 mmol/L (ref 21–32)
EGFR (African American): >60
Glucose: 132 mg/dL — ABNORMAL HIGH (ref 65–99)
Osmolality: 288 (ref 275–301)
Potassium: 3.9 mmol/L (ref 3.5–5.1)
Sodium: 143 mmol/L (ref 136–145)

## 2012-02-02 LAB — CBC WITH DIFFERENTIAL/PLATELET
Basophil #: 0.1 10*3/uL (ref 0.0–0.1)
Basophil %: 1 %
HCT: 40.9 % (ref 40.0–52.0)
Lymphocyte #: 1.6 10*3/uL (ref 1.0–3.6)
MCH: 31.5 pg (ref 26.0–34.0)
MCHC: 33.7 g/dL (ref 32.0–36.0)
MCV: 93 fL (ref 80–100)
Monocyte %: 5.6 %
Neutrophil #: 8 10*3/uL — ABNORMAL HIGH (ref 1.4–6.5)
Platelet: 187 10*3/uL (ref 150–440)
RDW: 16.4 % — ABNORMAL HIGH (ref 11.5–14.5)
WBC: 10.3 10*3/uL (ref 3.8–10.6)

## 2012-02-03 LAB — BASIC METABOLIC PANEL
Anion Gap: 9 (ref 7–16)
Calcium, Total: 8.5 mg/dL (ref 8.5–10.1)
Co2: 27 mmol/L (ref 21–32)
Creatinine: 0.68 mg/dL (ref 0.60–1.30)
EGFR (African American): >60
Potassium: 3.9 mmol/L (ref 3.5–5.1)
Sodium: 143 mmol/L (ref 136–145)

## 2012-02-03 LAB — CBC WITH DIFFERENTIAL/PLATELET
Basophil %: 0.7 %
Eosinophil %: 0 %
HCT: 36.2 % — ABNORMAL LOW (ref 40.0–52.0)
Lymphocyte #: 2.8 10*3/uL (ref 1.0–3.6)
MCH: 31.2 pg (ref 26.0–34.0)
MCHC: 32.6 g/dL (ref 32.0–36.0)
MCV: 96 fL (ref 80–100)
Monocyte #: 0.7 10*3/uL (ref 0.0–0.7)
Monocyte %: 6 %
Neutrophil %: 70.1 %
Platelet: 144 10*3/uL — ABNORMAL LOW (ref 150–440)
RBC: 3.77 10*6/uL — ABNORMAL LOW (ref 4.40–5.90)
WBC: 12.1 10*3/uL — ABNORMAL HIGH (ref 3.8–10.6)

## 2012-02-04 LAB — CBC WITH DIFFERENTIAL/PLATELET
Basophil #: 0 10*3/uL (ref 0.0–0.1)
Basophil %: 0.2 %
Eosinophil %: 1.5 %
HCT: 30 % — ABNORMAL LOW (ref 40.0–52.0)
HGB: 10.1 g/dL — ABNORMAL LOW (ref 13.0–18.0)
Lymphocyte #: 1.5 10*3/uL (ref 1.0–3.6)
Lymphocyte %: 24.4 %
MCH: 31.4 pg (ref 26.0–34.0)
MCHC: 32.9 g/dL (ref 32.0–36.0)
Monocyte #: 0.6 10*3/uL (ref 0.0–0.7)
Monocyte %: 8 %
Monocyte %: 6.9 %
Neutrophil #: 4.3 10*3/uL (ref 1.4–6.5)
Neutrophil %: 53.7 %
Neutrophil %: 67.2 %
Platelet: 123 10*3/uL — ABNORMAL LOW (ref 150–440)
Platelet: 128 10*3/uL — ABNORMAL LOW (ref 150–440)
RBC: 3.16 10*6/uL — ABNORMAL LOW (ref 4.40–5.90)
RBC: 3.26 10*6/uL — ABNORMAL LOW (ref 4.40–5.90)
WBC: 8 10*3/uL (ref 3.8–10.6)

## 2012-02-04 LAB — BASIC METABOLIC PANEL
BUN: 20 mg/dL — ABNORMAL HIGH (ref 7–18)
Calcium, Total: 8.6 mg/dL (ref 8.5–10.1)
EGFR (African American): >60
EGFR (Non-African Amer.): >60
Glucose: 84 mg/dL (ref 65–99)
Osmolality: 289 (ref 275–301)
Potassium: 4.2 mmol/L (ref 3.5–5.1)

## 2012-02-05 LAB — CBC WITH DIFFERENTIAL/PLATELET
Basophil #: 0 10*3/uL (ref 0.0–0.1)
Basophil %: 0.5 %
Eosinophil %: 1.8 %
HGB: 10.4 g/dL — ABNORMAL LOW (ref 13.0–18.0)
Lymphocyte %: 36.1 %
MCHC: 33.1 g/dL (ref 32.0–36.0)
Monocyte #: 0.5 10*3/uL (ref 0.0–0.7)
Monocyte %: 8.6 %
Neutrophil %: 53 %
Platelet: 132 10*3/uL — ABNORMAL LOW (ref 150–440)
RBC: 3.29 10*6/uL — ABNORMAL LOW (ref 4.40–5.90)
WBC: 6.1 10*3/uL (ref 3.8–10.6)

## 2012-02-05 LAB — BASIC METABOLIC PANEL
Anion Gap: 10 (ref 7–16)
BUN: 21 mg/dL — ABNORMAL HIGH (ref 7–18)
Chloride: 107 mmol/L (ref 98–107)
Co2: 26 mmol/L (ref 21–32)
Glucose: 82 mg/dL (ref 65–99)
Potassium: 3.9 mmol/L (ref 3.5–5.1)

## 2012-02-06 LAB — CBC WITH DIFFERENTIAL/PLATELET
Basophil #: 0 10*3/uL (ref 0.0–0.1)
Eosinophil #: 0.2 10*3/uL (ref 0.0–0.7)
Eosinophil %: 2.1 %
Lymphocyte %: 39.5 %
MCH: 31.5 pg (ref 26.0–34.0)
MCHC: 33 g/dL (ref 32.0–36.0)
MCV: 95 fL (ref 80–100)
Monocyte #: 0.5 10*3/uL (ref 0.0–0.7)
Neutrophil %: 50.8 %
Platelet: 126 10*3/uL — ABNORMAL LOW (ref 150–440)
RDW: 17.4 % — ABNORMAL HIGH (ref 11.5–14.5)

## 2012-02-06 LAB — BASIC METABOLIC PANEL
Anion Gap: 8 (ref 7–16)
BUN: 13 mg/dL (ref 7–18)
Chloride: 104 mmol/L (ref 98–107)
Creatinine: 0.63 mg/dL (ref 0.60–1.30)
EGFR (African American): >60
Glucose: 75 mg/dL (ref 65–99)
Osmolality: 276 (ref 275–301)
Potassium: 4 mmol/L (ref 3.5–5.1)
Sodium: 139 mmol/L (ref 136–145)

## 2012-02-07 LAB — BASIC METABOLIC PANEL
Anion Gap: 6 — ABNORMAL LOW (ref 7–16)
BUN: 15 mg/dL (ref 7–18)
Calcium, Total: 8.3 mg/dL — ABNORMAL LOW (ref 8.5–10.1)
Chloride: 104 mmol/L (ref 98–107)
EGFR (African American): >60
Glucose: 110 mg/dL — ABNORMAL HIGH (ref 65–99)
Osmolality: 277 (ref 275–301)
Potassium: 3.8 mmol/L (ref 3.5–5.1)

## 2012-02-08 LAB — CULTURE, BLOOD (SINGLE)

## 2012-03-07 ENCOUNTER — Ambulatory Visit: Payer: Self-pay | Admitting: Geriatric Medicine

## 2012-04-02 ENCOUNTER — Emergency Department: Payer: Self-pay | Admitting: *Deleted

## 2012-05-13 ENCOUNTER — Other Ambulatory Visit: Payer: Self-pay | Admitting: Geriatric Medicine

## 2012-05-13 LAB — URINALYSIS, COMPLETE
Bacteria: NONE SEEN
Glucose,UR: NEGATIVE mg/dL (ref 0–75)
Ketone: NEGATIVE
Leukocyte Esterase: NEGATIVE
Nitrite: NEGATIVE
RBC,UR: <1 /HPF (ref 0–5)
Specific Gravity: 1.008 (ref 1.003–1.030)
WBC UR: <1 /HPF (ref 0–5)

## 2012-05-14 LAB — URINE CULTURE

## 2012-06-04 ENCOUNTER — Emergency Department: Payer: Self-pay | Admitting: Emergency Medicine

## 2012-06-04 LAB — COMPREHENSIVE METABOLIC PANEL
Albumin: 3.7 g/dL (ref 3.4–5.0)
Alkaline Phosphatase: 78 U/L (ref 50–136)
Bilirubin,Total: 0.5 mg/dL (ref 0.2–1.0)
Chloride: 104 mmol/L (ref 98–107)
Co2: 26 mmol/L (ref 21–32)
Creatinine: 0.75 mg/dL (ref 0.60–1.30)
EGFR (African American): >60
EGFR (Non-African Amer.): >60
Glucose: 94 mg/dL (ref 65–99)
Osmolality: 287 (ref 275–301)
SGOT(AST): 17 U/L (ref 15–37)
SGPT (ALT): 17 U/L
Sodium: 143 mmol/L (ref 136–145)
Total Protein: 6.5 g/dL (ref 6.4–8.2)

## 2012-06-04 LAB — CBC
HGB: 12 g/dL — ABNORMAL LOW (ref 13.0–18.0)
MCHC: 32.5 g/dL (ref 32.0–36.0)
MCV: 93 fL (ref 80–100)
RDW: 14.3 % (ref 11.5–14.5)

## 2012-06-04 LAB — CK TOTAL AND CKMB (NOT AT ARMC): CK, Total: 197 U/L (ref 35–232)

## 2012-06-04 LAB — ETHANOL: Ethanol %: <0.003 % (ref 0.000–0.080)

## 2013-07-31 IMAGING — CR DG ABDOMEN 3V
1 series · 4 of 4 positions shown · non-contrast
Comparison: none

REASON FOR EXAM: abd pain
COMMENTS:

[Series 1: w chest pa · 0.14mm/px · 4 of 4 slices shown]
[im 1/4]
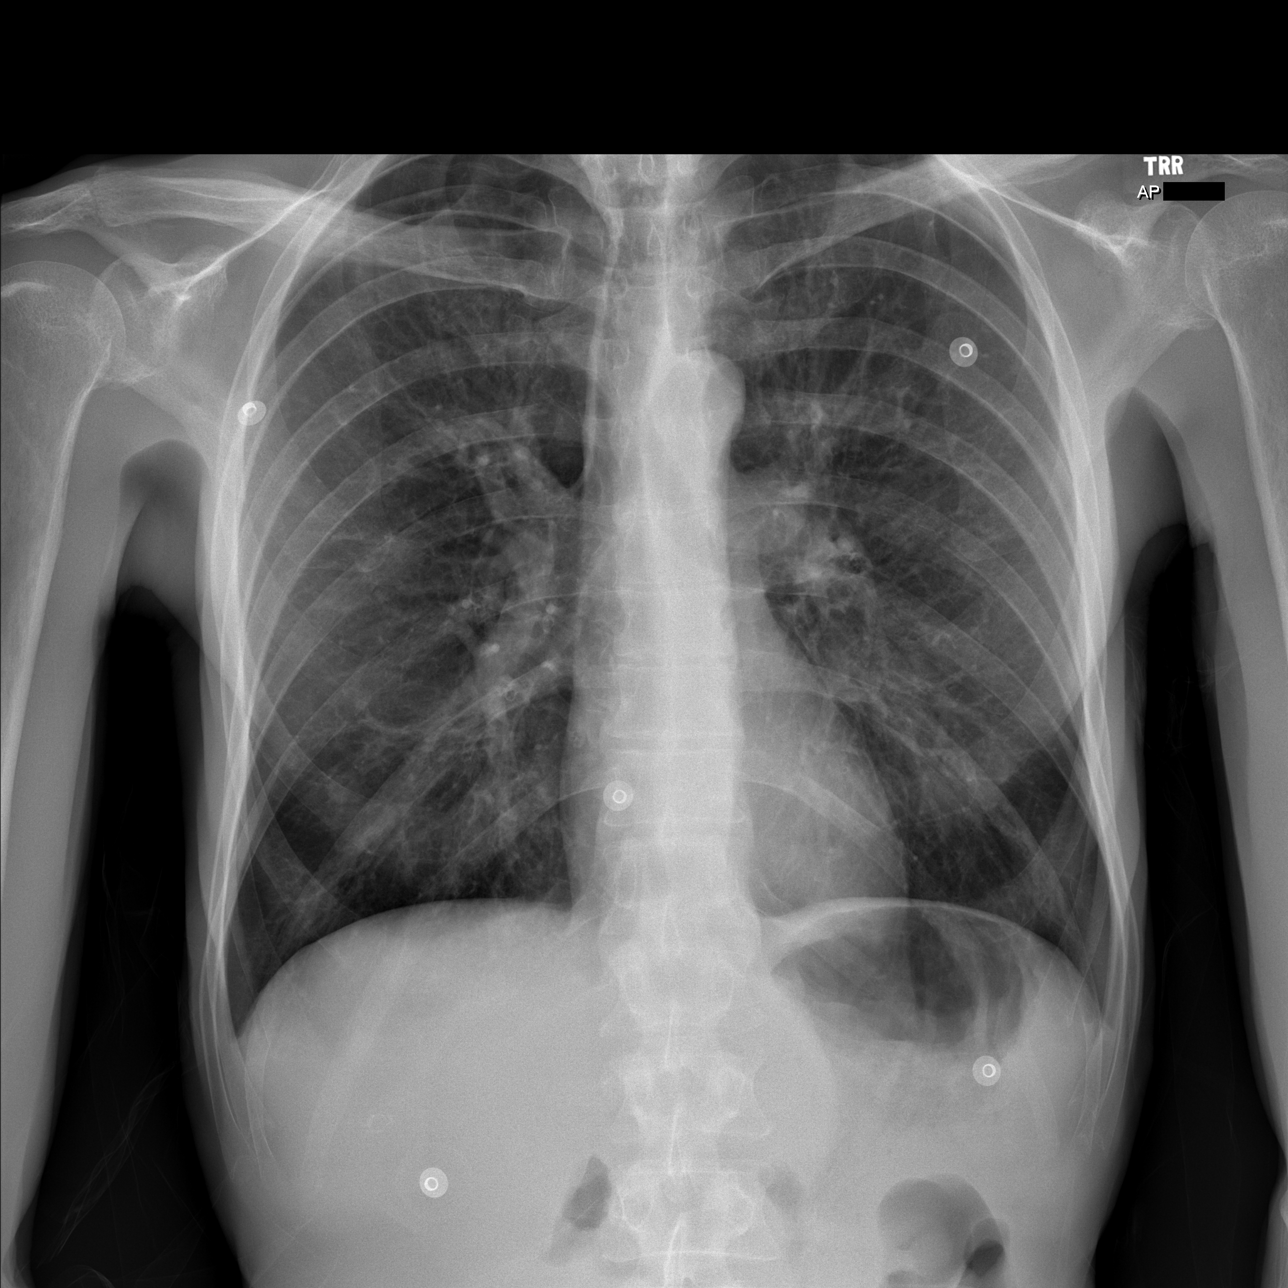
[im 2/4]
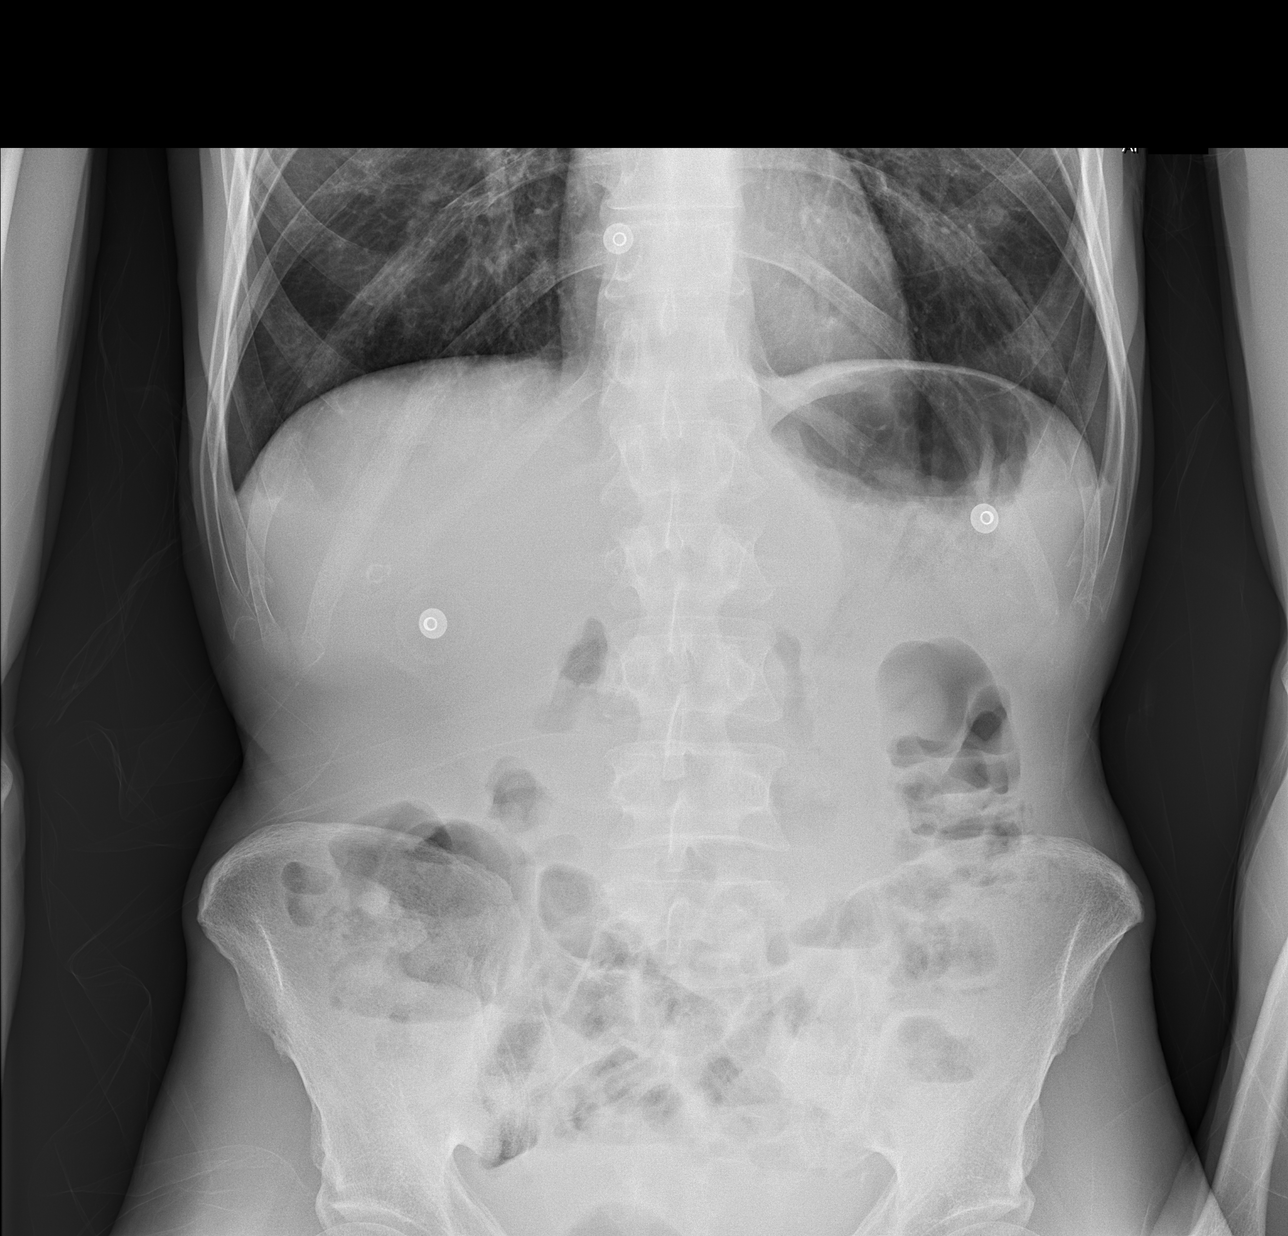
[im 3/4]
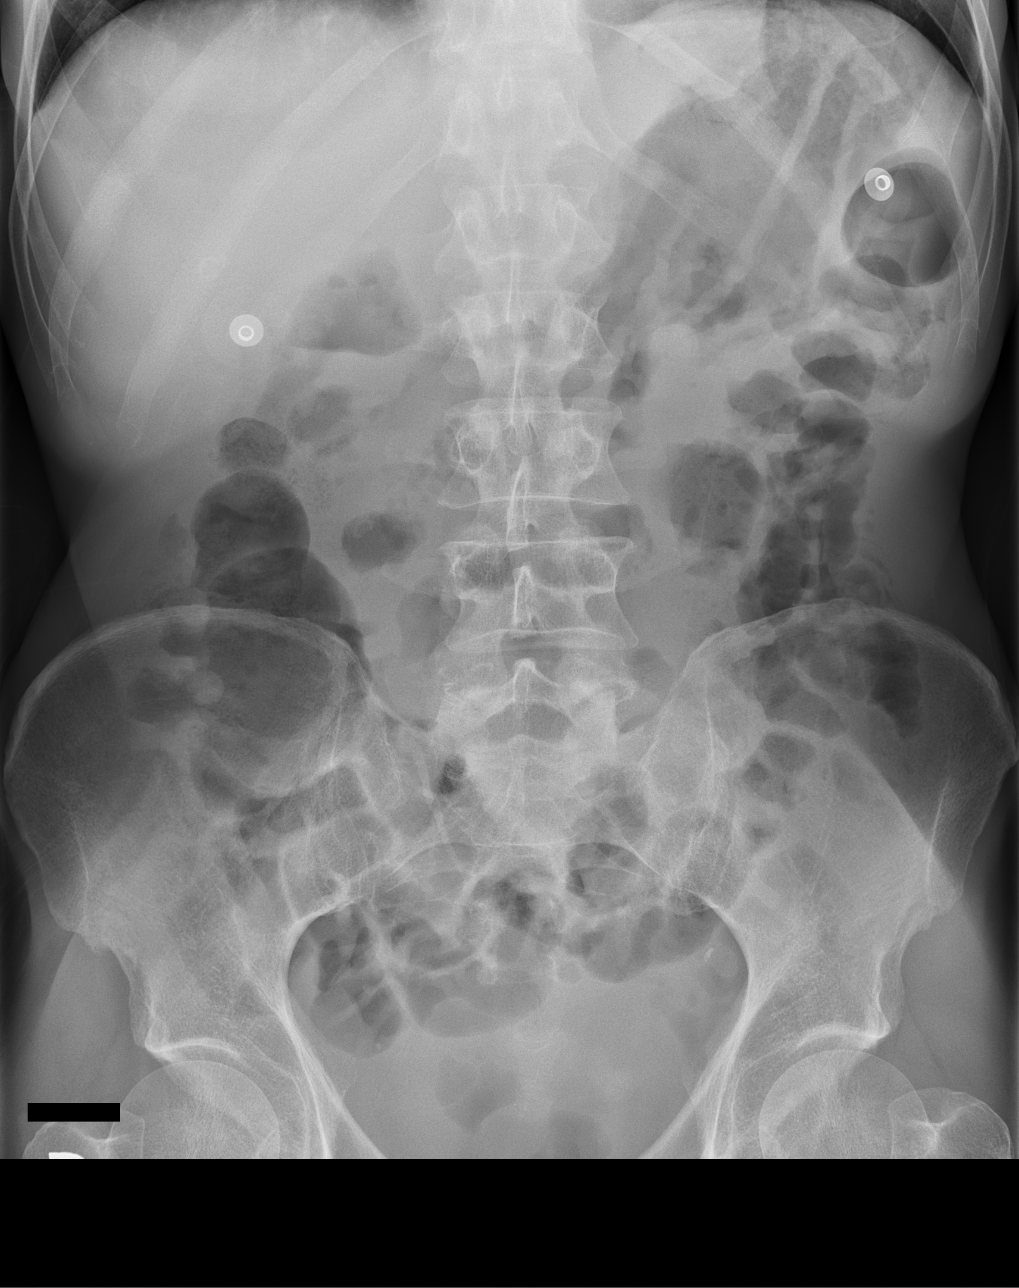
[im 4/4]
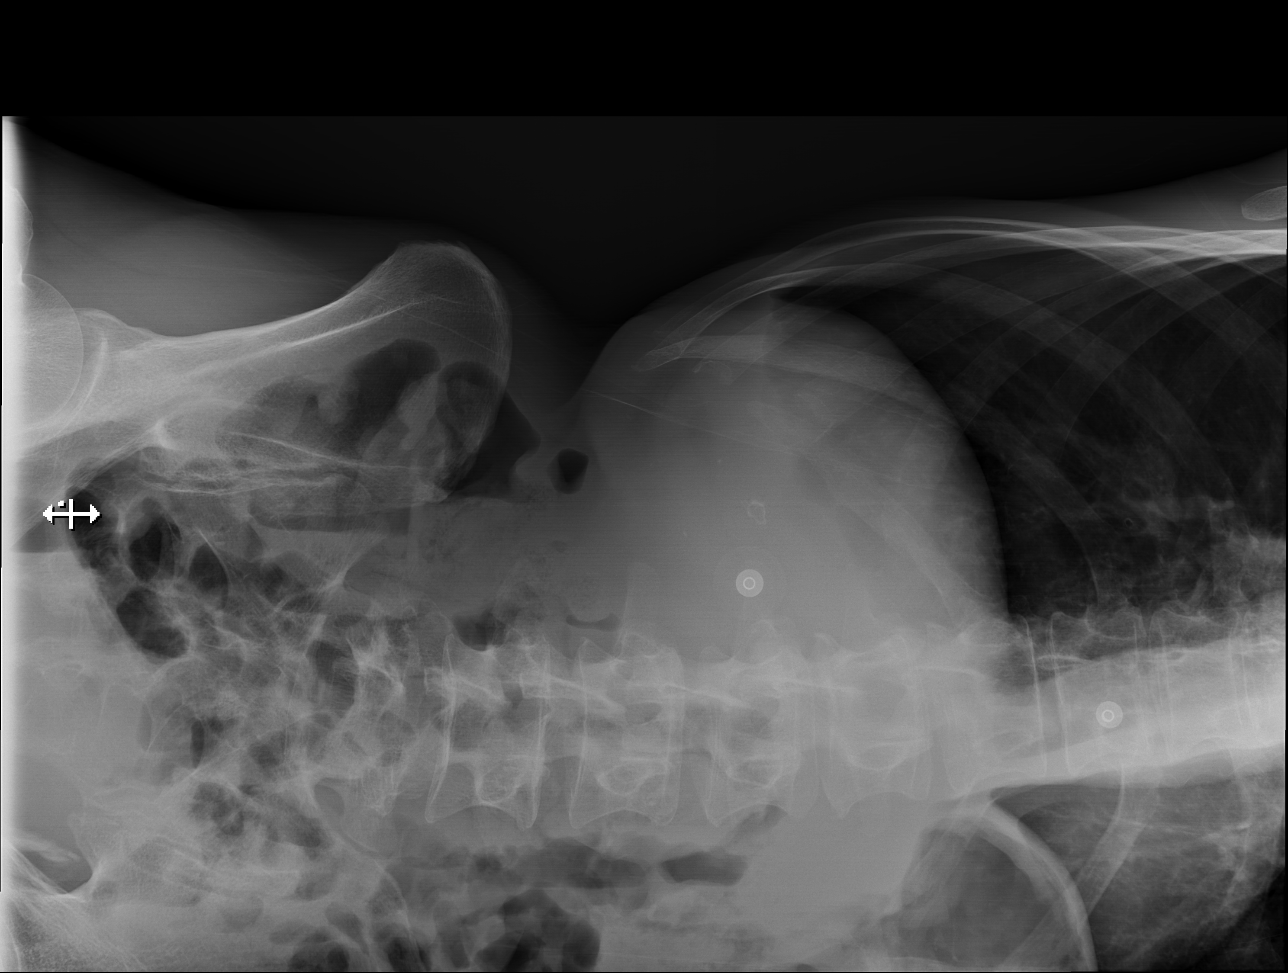

[4 of 4 positions shown; findings below may reference images not displayed]

PROCEDURE:     DXR - DXR ABDOMEN 3-WAY (INCL PA CXR)  - December 30, 2011  [DATE]

RESULT:

PA view of the chest shows the lung fields to be clear. The heart size is
normal.

Multiple views of the abdomen were obtained. No subdiaphragmatic free air is
seen. The bowel gas pattern shows no specific abnormalities. No dilated
bowel loops are seen. There are a few, scattered, nonspecific fluid levels
in large and small bowel. Such findings are nonspecific and are frequently
seen with gastroenteritis, enteritis or intra-abdominal inflammation. No
abnormal intra-abdominal calcifications are identified. The osseous
structures are normal in appearance.
IMPRESSION: 1.  No findings suggestive of bowel obstruction or of other acute changes
identified.
2.  There are scattered nonspecific fluid levels in nondilated loops of
bowel as noted above.

## 2013-08-06 IMAGING — CR DG CHEST 1V PORT
1 series · 1 of 1 positions shown · non-contrast
Comparison: none

REASON FOR EXAM: leukocytosis, pt at risk for aspiration, mild crackles
on exam this AM
COMMENTS:   LMP: (Male)

[portable]
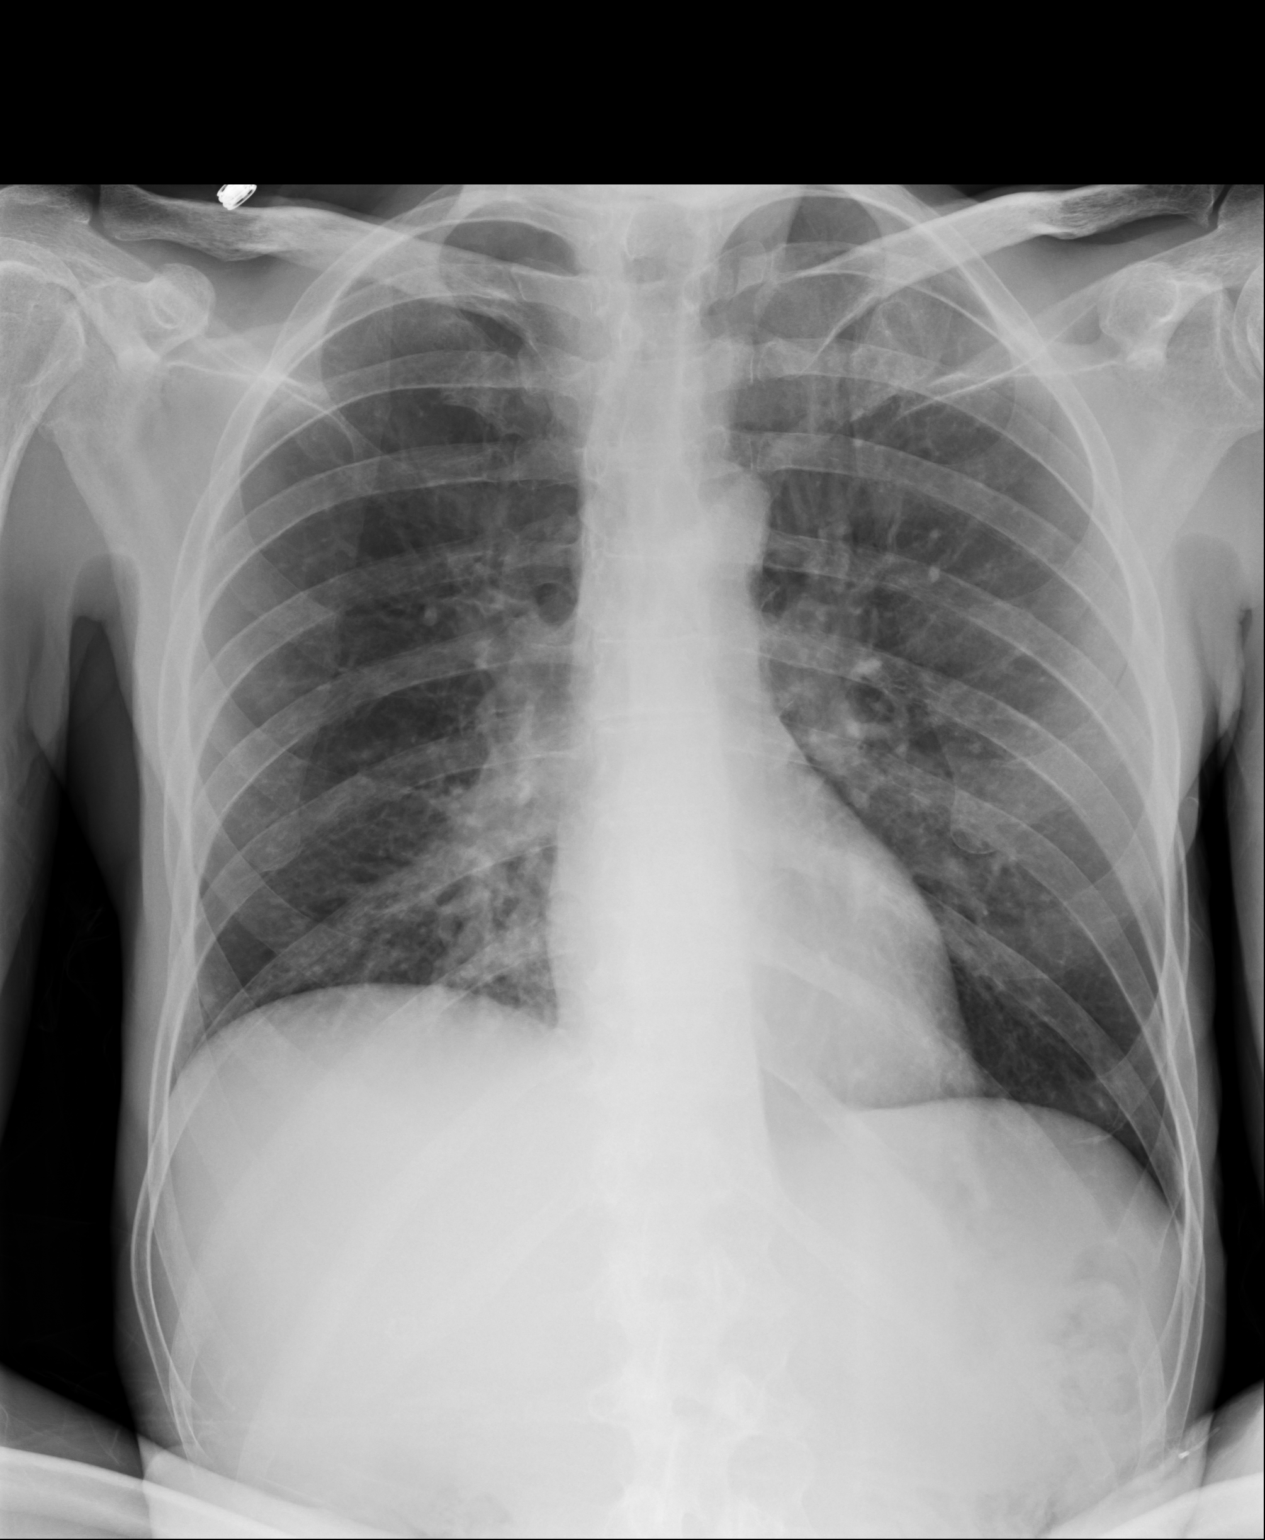

[1 of 1 positions shown; findings below may reference images not displayed]

PROCEDURE:     DXR - DXR PORTABLE CHEST SINGLE VIEW  - January 05, 2012  [DATE]

RESULT:     Comparison is made to the prior exam of 03/15/2011. Allowing for
difference in the inspiratory level, no significant interval changes are
seen. No pneumonia, pneumothorax or pleural effusion is noted. There is
thickening of the lung markings at the right cardiophrenic angle compatible
with the lack of deep inspiration. Heart size is normal.
IMPRESSION: No acute changes are identified.

## 2013-08-08 IMAGING — CT CT CHEST W/O CM
2 series · 15 of 31 positions shown, 19 images · non-contrast
Comparison: none

REASON FOR EXAM: abnormal CXR, eval. further, incl. for poss. bronchial
obstruction vs. atelectas
COMMENTS:   LMP: (Male)

PROCEDURE:     CT  - CT CHEST WITHOUT CONTRAST  - January 07, 2012  [DATE]
RESULT:     Comparison: Chest radiograph performed same day
TECHNIQUE: Multiple axial images of the chest were obtained without
intravenous contrast.

[Series 2: soft tissue · axial · 0.67mm/px · z∈[-480,-430]mm · 2 of 63 slices shown]
[im 5/63  mediastinal]
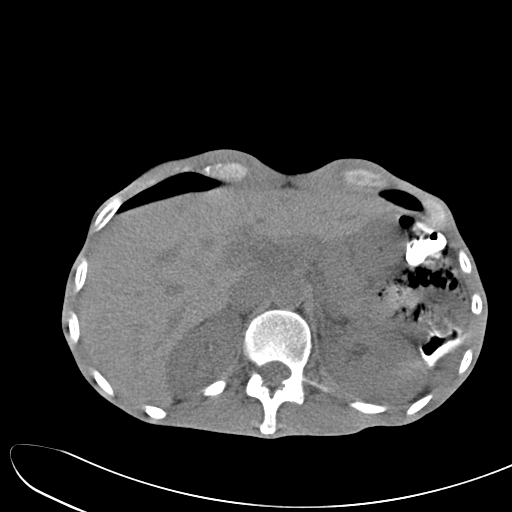
[im 15/63  mediastinal]
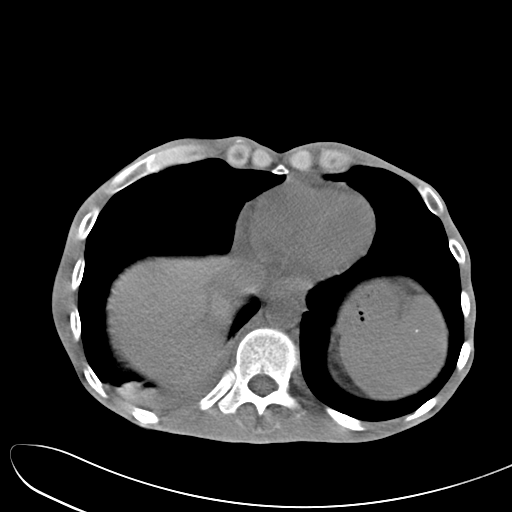

[Series 3: lung windows · axial · 0.67mm/px · z∈[-470,-214]mm · 13 of 61 slices shown, 17 images]
[im 5/61  mediastinal]
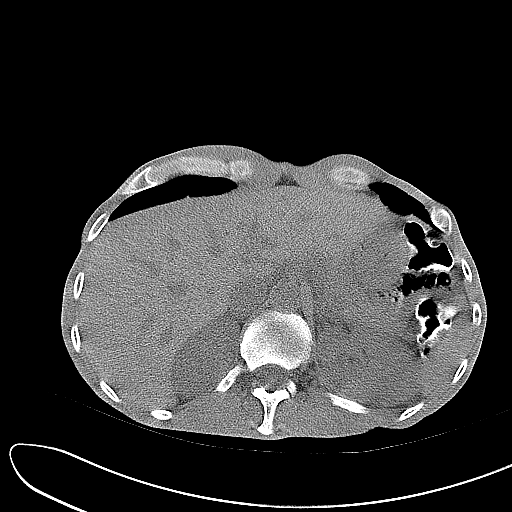
[im 5/61  lung]
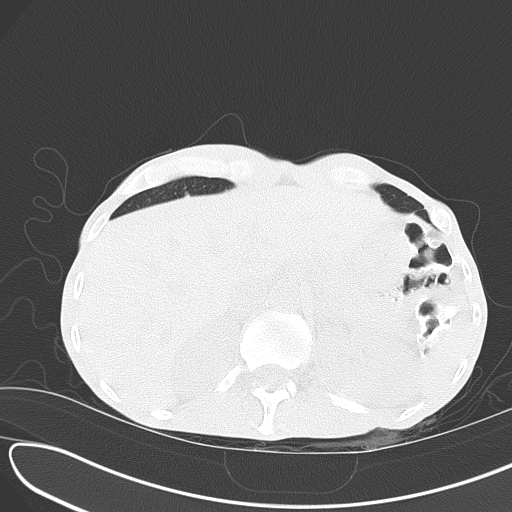
[im 10/61  lung]
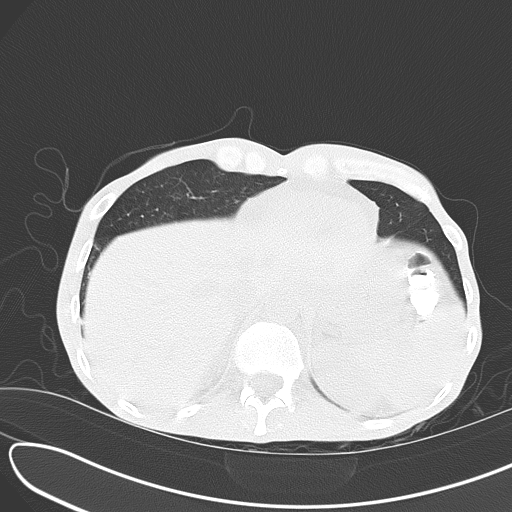
[im 14/61  lung]
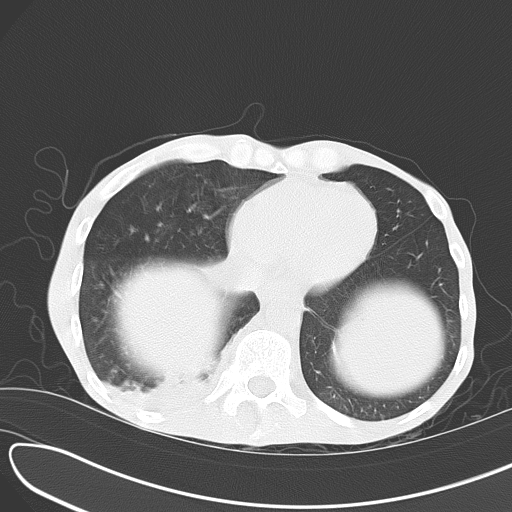
[im 19/61  lung]
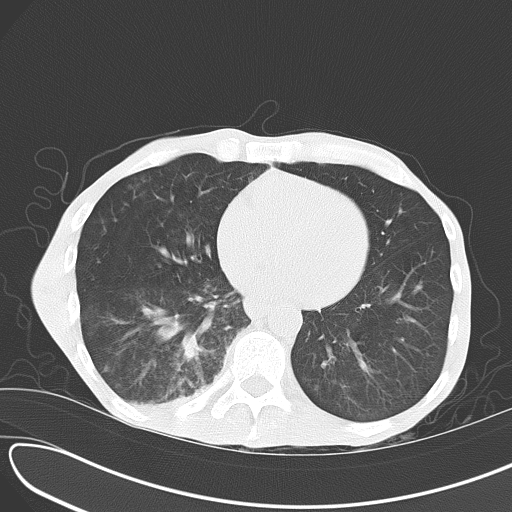
[im 24/61  mediastinal]
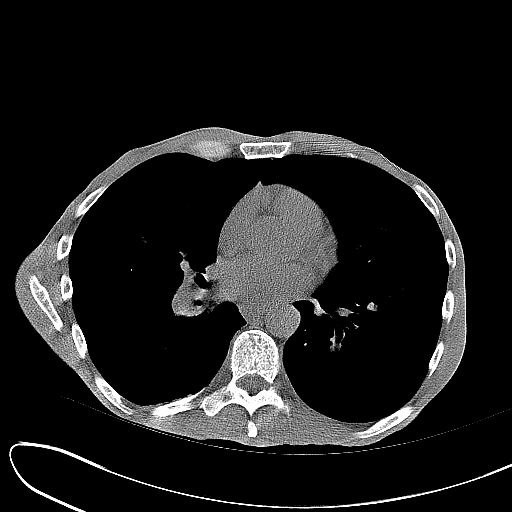
[im 24/61  lung]
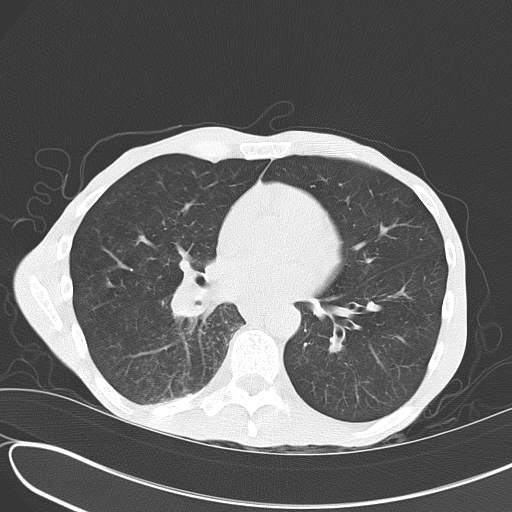
[im 28/61  lung]
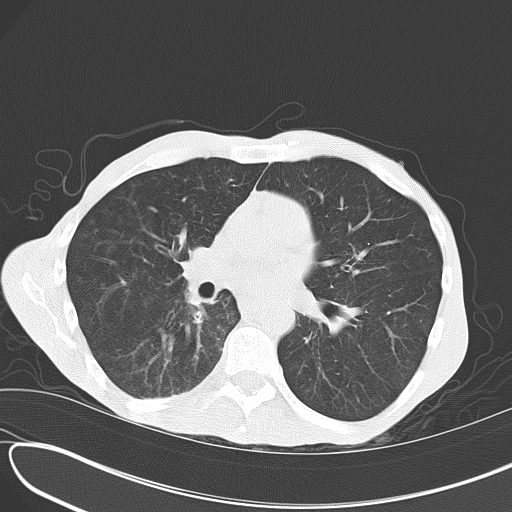
[im 31/61  lung]
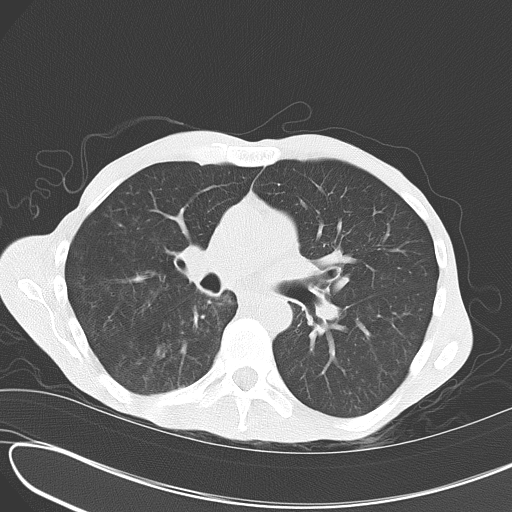
[im 33/61  lung]
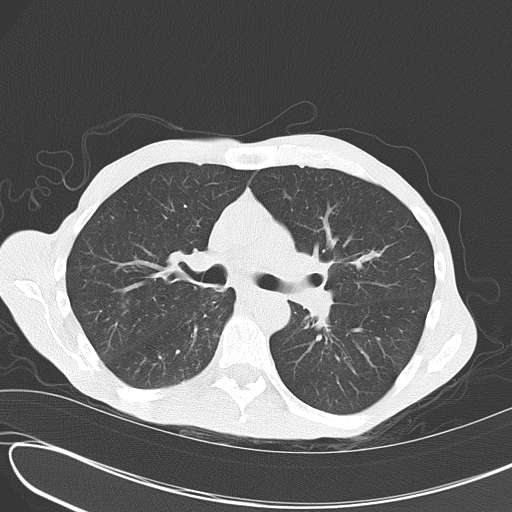
[im 37/61  mediastinal]
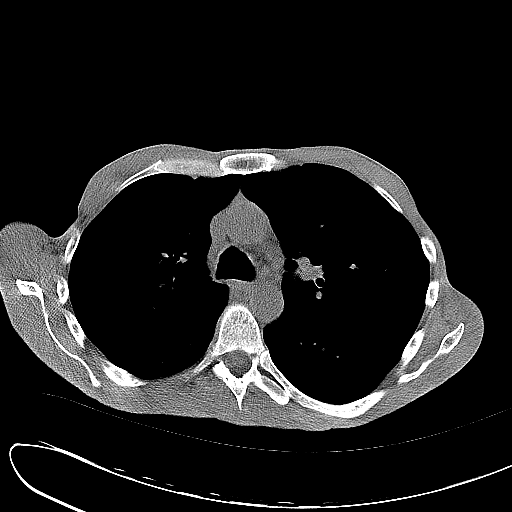
[im 37/61  lung]
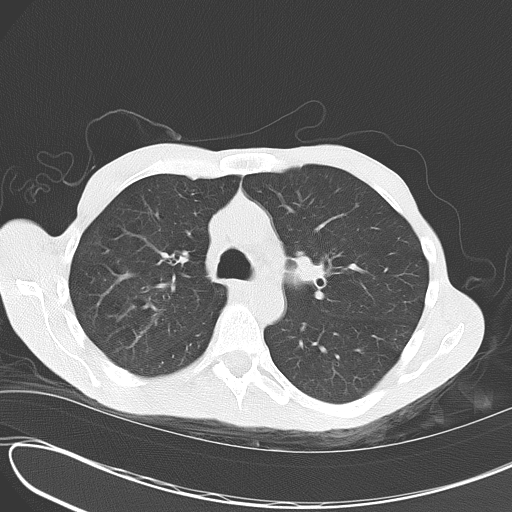
[im 42/61  lung]
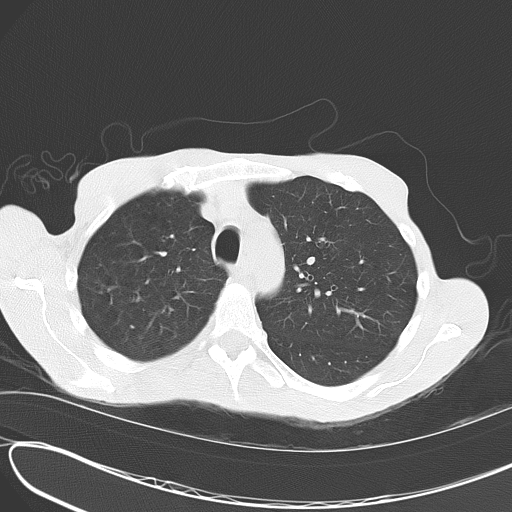
[im 47/61  lung]
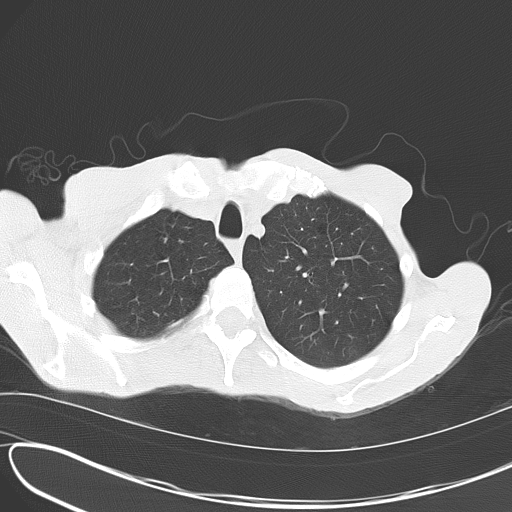
[im 51/61  lung]
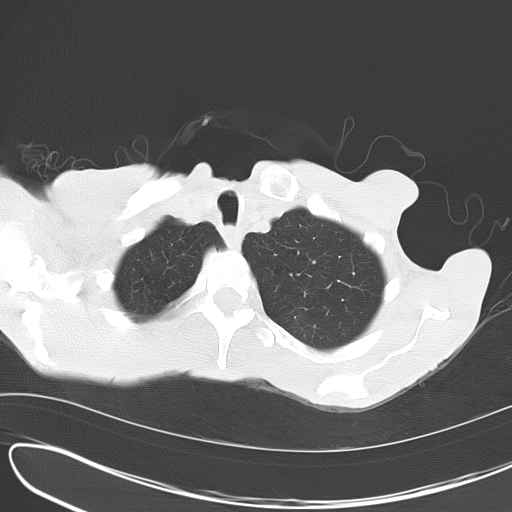
[im 56/61  mediastinal]
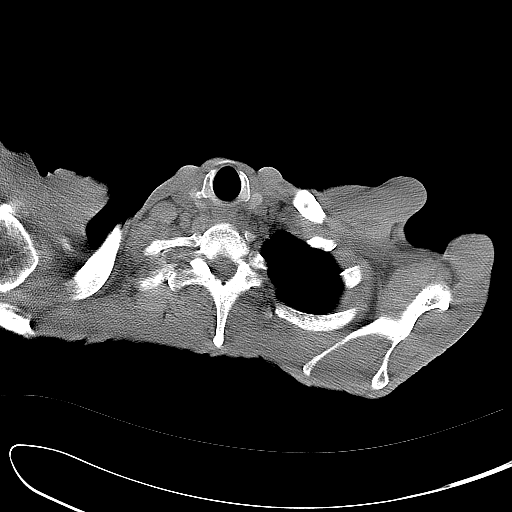
[im 56/61  lung]
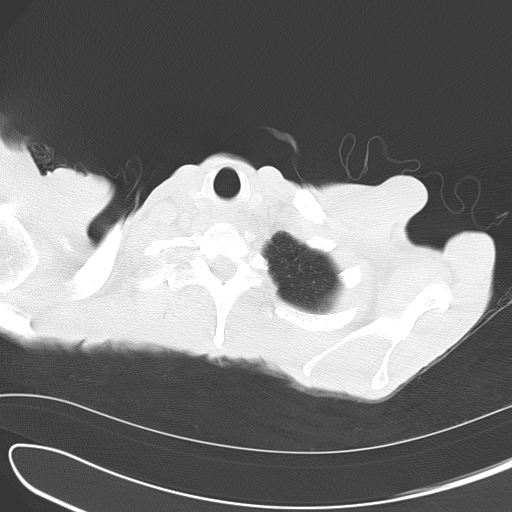

[15 of 31 positions shown; findings below may reference images not displayed]

FINDINGS: No mediastinal, hilar, or axillary lymphadenopathy identified. Punctate
calcification in the spleen is likely sequela of old prior infection. There
is residual contrast material in the colon.

There is filling and opacification of the right lower lobe bronchus. There
is mild adjacent to heterogeneous opacification secondary to atelectasis.
There are centrilobular opacities in the right lower lobe, which are more
confluent in the posterior right lower lobe. There is mild volume loss of
the right lower lobe.  Subtle centrilobular nodular opacities are seen in
the posterior right upper lobe.

No aggressive lytic or sclerotic osseous lesions are identified.
IMPRESSION: 1. Nonspecific opacification of the right lower lobe bronchus. This may be
secondary to mucous plugging. An endobronchial mass cannot be excluded.
Opacities in the aerated right lower lobe may be secondary to infection or
postobstructive pneumonitis. At a minimum, short term followup radiographs
are recommended to ensure resolution. If the findings do not resolve or
there are is no current sign of infection, bronchoscopy would be recommended.
2. Subtle centrilobular nodules in the posterior right upper lobe may be
secondary to infection, such as atypical infection.

## 2015-04-20 NOTE — Consult Note (Signed)
Chief Complaint:   Subjective/Chief Complaint stable, now on dopamine, off levophed  no c/o, minimal responses, appears at prior baseline.   VITAL SIGNS/ANCILLARY NOTES: **Vital Signs.:   21-Jan-13 17:00   Pulse Pulse 66   Respirations Respirations 14   Systolic BP Systolic BP 101   Diastolic BP (mmHg) Diastolic BP (mmHg) 61   Mean BP 74   Pulse Ox % Pulse Ox % 98   Brief Assessment:   Cardiac Regular    Respiratory clear BS    Gastrointestinal details normal Soft  Nontender  Nondistended  No masses palpable  Bowel sounds normal   Routine Chem:  21-Jan-13 10:19    Magnesium, Serum 1.7    15:41    Magnesium, Serum 1.8   Assessment/Plan:  Assessment/Plan:   Assessment 1) failure to thrive, aspiration risk   2) possible aspiration pneumonia.  improved 3) hypotension/hypothermia, improving but still on pressor.    Plan 1) will discuss further with Surgery timing of peg.   Electronic Signatures: Barnetta ChapelSkulskie, Laurence Folz (MD)  (Signed 21-Jan-13 17:41)  Authored: Chief Complaint, VITAL SIGNS/ANCILLARY NOTES, Brief Assessment, Lab Results, Assessment/Plan   Last Updated: 21-Jan-13 17:41 by Barnetta ChapelSkulskie, Gilford Lardizabal (MD)

## 2015-04-20 NOTE — Consult Note (Signed)
Chief Complaint:   Subjective/Chief Complaint stable though still on pressor.  no apparent c/o.   VITAL SIGNS/ANCILLARY NOTES: **Vital Signs.:   18-Jan-13 12:00   Temperature Temperature (F) 93.5   Celsius 34.1   Pulse Pulse 36   Respirations Respirations 10   Systolic BP Systolic BP 82   Diastolic BP (mmHg) Diastolic BP (mmHg) 51   Mean BP 61   Pulse Ox % Pulse Ox % 100   Brief Assessment:   Cardiac Regular    Respiratory clear BS    Gastrointestinal details normal Soft  Nontender  Nondistended  No masses palpable  Bowel sounds normal     Routine Hem:  18-Jan-13 03:39    WBC (CBC) 4.5   RBC (CBC) 3.87   Hemoglobin (CBC) 11.8   Hematocrit (CBC) 35.4   Platelet Count (CBC) 259   MCV 92   MCH 30.5   MCHC 33.3   RDW 16.8   Neutrophil % 54.6   Lymphocyte % 35.4   Monocyte % 8.1   Eosinophil % 1.3   Basophil % 0.6   Neutrophil # 2.5   Lymphocyte # 1.6   Monocyte # 0.4   Eosinophil # 0.1   Basophil # 0.0  Routine Chem:  18-Jan-13 03:39    Glucose, Serum 129   BUN 5   Creatinine (comp) 0.44   Sodium, Serum 146   Potassium, Serum 3.6   Chloride, Serum 105   CO2, Serum 32   Calcium (Total), Serum 8.5   Anion Gap 9   Osmolality (calc) 290   eGFR (African American) >60   eGFR (Non-African American) >60   Radiology Results: Cardiology:    17-Jan-13 09:37, ECG   ECG interpretation    Marked sinus bradycardia  Abnormal ECG  When compared with ECG of 23-Mar-2010 18:21,  Vent. rate has decreased BY  22 BPM  Nonspecific T wave abnormality no longer evident in Inferior leads  QT has lengthened  ----------unconfirmed----------  Confirmed by OVERREAD, NOT ([id number]), editor PEARSON, BARBARA ([id number]) on 01/13/2012 2:00:10 PM   Assessment/Plan:  Assessment/Plan:   Assessment 1) failure to thrive 2) risk of aspiration pneumonia in the setting  of traumatic brain injury 3) hypotension currently on pressor, hypothermia-likely central    Plan 1) discussed with surgery,  would be best to get him off the pressor prior to proceedure.  also discussed with palliative care.   Electronic Signatures for Addendum Section:  Loistine Simas (MD) (Signed Addendum 18-Jan-13 14:06)  will reassess patient for peg on monday.  if further GI assistance is needed, Dr Vira Agar covering over the weekend.   Electronic Signatures: Loistine Simas (MD)  (Signed 18-Jan-13 13:21)  Authored: Chief Complaint, VITAL SIGNS/ANCILLARY NOTES, Brief Assessment, Lab Results, Radiology Results, Assessment/Plan   Last Updated: 18-Jan-13 14:06 by Loistine Simas (MD)
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
## 2015-04-20 NOTE — Consult Note (Signed)
Chief Complaint:   Subjective/Chief Complaint doing about the same. Patient will need a f/u CT scan of the chest   VITAL SIGNS/ANCILLARY NOTES: **Vital Signs.:   05-Feb-13 09:20   Vital Signs Type Post-Procedure   Temperature Temperature (F) 97.4   Celsius 36.3   Temperature Source axillary   Pulse Pulse 55   Pulse source per Dinamap   Respirations Respirations 18   Systolic BP Systolic BP 361   Diastolic BP (mmHg) Diastolic BP (mmHg) 73   Mean BP 85   BP Source Dinamap   Pulse Ox % Pulse Ox % 96   Pulse Ox Activity Level  At rest   Oxygen Delivery Room Air/ 21 %  *Intake and Output.:   Daily 05-Feb-13 07:00   Grand Totals Intake:  760 Output:      Net:  760 24 Hr.:  760   Oral Intake      In:  760   Length of Stay Totals Intake:  22449 Output:  75300    Net:  27629   Brief Assessment:   Cardiac Regular  -- LE edema  --Gallop    Respiratory normal resp effort  clear BS  no use of accessory muscles    Gastrointestinal details normal Soft  Nontender  Bowel sounds normal  No rigidity   Routine Hem:  05-Feb-13 04:45    WBC (CBC) 6.1   RBC (CBC) 3.78   Hemoglobin (CBC) 11.8   Hematocrit (CBC) 35.7   Platelet Count (CBC) 172   MCV 95   MCH 31.1   MCHC 32.9   RDW 17.5  Routine Chem:  05-Feb-13 04:45    Glucose, Serum 79   BUN 18   Creatinine (comp) 0.71   Sodium, Serum 144   Potassium, Serum 4.4   Chloride, Serum 105   CO2, Serum 27   Calcium (Total), Serum 8.8   Osmolality (calc) 288   eGFR (African American) >60   eGFR (Non-African American) >60   Anion Gap 12  Routine Hem:  05-Feb-13 04:45    Neutrophil % 39.9   Lymphocyte % 51.6   Monocyte % 7.5   Eosinophil % 0.6   Basophil % 0.4   Neutrophil # 2.4   Lymphocyte # 3.1   Monocyte # 0.5   Eosinophil # 0.0   Basophil # 0.0   Assessment/Plan:  Assessment/Plan:   Assessment 1/ Atelectasis -will schedule f/u CT scan of the chest to assess the lower lobe -if there is still persistant collapse  then plan on bronch   Electronic Signatures: Allyne Gee (MD)  (Signed 05-Feb-13 10:31)  Authored: Chief Complaint, VITAL SIGNS/ANCILLARY NOTES, Brief Assessment, Lab Results, Assessment/Plan   Last Updated: 05-Feb-13 10:31 by Allyne Gee (MD)

## 2015-04-20 NOTE — Consult Note (Signed)
Chief Complaint:   Subjective/Chief Complaint stable, seems to deny abdominal pain.  palliative care has seen patient.   VITAL SIGNS/ANCILLARY NOTES: **Vital Signs.:   17-Jan-13 13:15   Pulse Pulse 60   Systolic BP Systolic BP 150   Diastolic BP (mmHg) Diastolic BP (mmHg) 57   Mean BP 73    13:30   Pulse Pulse 66   Systolic BP Systolic BP 91   Diastolic BP (mmHg) Diastolic BP (mmHg) 53   Mean BP 65   Brief Assessment:   Cardiac Regular    Respiratory clear BS    Gastrointestinal details normal Soft  Nontender  Nondistended  No masses palpable  Bowel sounds normal   Routine Hem:  17-Jan-13 03:44    WBC (CBC) 10.7   RBC (CBC) 3.46   Hemoglobin (CBC) 10.6   Hematocrit (CBC) 31.7   Platelet Count (CBC) 299   MCV 92   MCH 30.7   MCHC 33.5   RDW 16.8  Routine Chem:  17-Jan-13 03:44    Glucose, Serum 119   BUN 5   Creatinine (comp) 0.54   Sodium, Serum 143   Potassium, Serum 3.7   Chloride, Serum 102   CO2, Serum 31   Calcium (Total), Serum 8.0  Hepatic:  17-Jan-13 03:44    Bilirubin, Total 0.4   Alkaline Phosphatase 65   SGPT (ALT) 16   SGOT (AST) 15   Total Protein, Serum 5.4   Albumin, Serum 2.5  Routine Chem:  17-Jan-13 03:44    Osmolality (calc) 283   eGFR (African American) >60   eGFR (Non-African American) >60   Anion Gap 10  Routine Hem:  17-Jan-13 03:44    Neutrophil % 79.1   Lymphocyte % 15.1   Monocyte % 5.4   Eosinophil % 0.1   Basophil % 0.3   Neutrophil # 8.4   Lymphocyte # 1.6   Monocyte # 0.6   Eosinophil # 0.0   Basophil # 0.0  Routine Chem:  17-Jan-13 03:44    Magnesium, Serum 1.4  Cardiology:  17-Jan-13 09:37    Ventricular Rate 44   Atrial Rate 44   P-R Interval 172   QRS Duration 92   QT 558   QTc 477   P Axis 77   R Axis 67   T Axis 73  Routine Chem:  17-Jan-13 11:02    Magnesium, Serum 2.3   Assessment/Plan:  Assessment/Plan:   Assessment 1) FFT, high risk for aspiration.    Plan 1) patient seen by  palliative care.  Wife now wants to do PEG per this consult.  Will arrange for tomorrow, with surgery assistance.   Electronic Signatures: Loistine Simas (MD)  (Signed 17-Jan-13 16:01)  Authored: Chief Complaint, VITAL SIGNS/ANCILLARY NOTES, Brief Assessment, Lab Results, Assessment/Plan   Last Updated: 17-Jan-13 16:01 by Loistine Simas (MD)
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
## 2015-04-20 NOTE — Consult Note (Signed)
Chief Complaint:   Subjective/Chief Complaint Note from Dr Harl BowieMassoud noted and appreciated.  Unfortunately patietn ate a solid breakfast.  Will be unable to PEG do this pm because of this.  I am not on call this weekend, will plan for PEG monday.   VITAL SIGNS/ANCILLARY NOTES: **Vital Signs.:   01-Feb-13 06:54   Vital Signs Type Routine   Temperature Temperature (F) 98.3   Celsius 36.8   Temperature Source oral   Pulse Pulse 53   Pulse source per Dinamap   Respirations Respirations 18   Systolic BP Systolic BP 105   Diastolic BP (mmHg) Diastolic BP (mmHg) 75   Mean BP 85   BP Source Dinamap   Pulse Ox % Pulse Ox % 95   Pulse Ox Activity Level  At rest   Oxygen Delivery Room Air/ 21 %   Electronic Signatures: Barnetta ChapelSkulskie, Martin (MD)  (Signed 01-Feb-13 11:19)  Authored: Chief Complaint, VITAL SIGNS/ANCILLARY NOTES   Last Updated: 01-Feb-13 11:19 by Barnetta ChapelSkulskie, Martin (MD)
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
## 2015-04-20 NOTE — Consult Note (Signed)
Chief Complaint:   Subjective/Chief Complaint Patietn at baseline, appears comfortable, sleepy.   VITAL SIGNS/ANCILLARY NOTES: **Vital Signs.:   06-Feb-13 14:06   Vital Signs Type Routine   Temperature Temperature (F) 99.5   Celsius 37.5   Temperature Source oral   Pulse Pulse 97   Pulse source per Dinamap   Respirations Respirations 18   Systolic BP Systolic BP 93   Diastolic BP (mmHg) Diastolic BP (mmHg) 67   Mean BP 75   BP Source Dinamap   Pulse Ox % Pulse Ox % 92   Pulse Ox Activity Level  At rest   Oxygen Delivery Room Air/ 21 %   Brief Assessment:   Cardiac Regular    Respiratory clear BS    Gastrointestinal details normal Soft  Nontender  Nondistended  No masses palpable  Bowel sounds normal   Routine Hem:  06-Feb-13 08:43    WBC (CBC) 10.3   RBC (CBC) 4.38   Hemoglobin (CBC) 13.8   Hematocrit (CBC) 40.9   Platelet Count (CBC) 187   MCV 93   MCH 31.5   MCHC 33.7   RDW 16.4  Routine Chem:  06-Feb-13 08:43    Glucose, Serum 132   BUN 15   Creatinine (comp) 0.81   Sodium, Serum 143   Potassium, Serum 3.9   Chloride, Serum 103   CO2, Serum 30   Calcium (Total), Serum 9.1   Osmolality (calc) 288   eGFR (African American) >60   eGFR (Non-African American) >60   Anion Gap 10  Routine Hem:  06-Feb-13 08:43    Neutrophil % 77.3   Lymphocyte % 15.9   Monocyte % 5.6   Eosinophil % 0.2   Basophil % 1.0   Neutrophil # 8.0   Lymphocyte # 1.6   Monocyte # 0.6   Eosinophil # 0.0   Basophil # 0.1   Radiology Results: CT:    05-Feb-13 11:08, CT Chest With Contrast   CT Chest With Contrast    REASON FOR EXAM:    f/u atelectasis  COMMENTS:       PROCEDURE: CT  - CT CHEST WITH CONTRAST  - Feb 01 2012 11:08AM     RESULT: Axial CT scanning was performed through the chest with   reconstructions at 5 mm intervals and slice thicknesses following  intravenous administration of 75 cc of Isovue-370 Comparison is made to   the study of 07 January 2012. Review  of multiplanar reconstructed images   was performed separately on the VIA monitor.    Since the previous study there has been considerable improvement in the   appearance of the right lower lobe posteriorly. Minimally increased   interstitial density persists. There is a small amount of pleural   thickening posteriorly with a trace of fluid but overall the appearance     of the processin the right lower hemithorax has improved. The left lung   exhibits no acute abnormality. There are emphysematous changes   bilaterally. There is a stable appearing approximately 3 mm diameter very   faint nodule in subpleural location in the right lower lobe on image  36.    At mediastinal window settings the cardiac chambers are normal in size.   The caliber of the thoracic aorta is normal. I see no bulky mediastinal   or hilar lymph nodes, but there is persistent mild soft tissue fullness   in the right hilum. There is likely intraluminal fluid in the right   mainstem  bronchus layering posteriorly suggesting secretions. This is   demonstrated best on images 26 through 32.     Within the upper abdomen the observed portions of the liver arenormal.   There is a tiny amount of air present which appears to lie just under the     anterior aspect of the right hemidiaphragm along the course of the   falciform ligament and the anterior surface of the liver. This is   suspicious for pneumoperitoneum.    IMPRESSION:   1. There has been improvement in the process in the right lower lobe   posteriorly. Soft tissue fullness in the right hilum is present seen on   images 30 and 34 which will merit continued followup. There remain   emphysematous changes in both lungs.  2. There is fluid in the right mainstem bronchus consistent with   secretions.  3. There may be a tiny amount of pneumoperitoneum in the right upper   quadrant along the anterior aspect of the liver along the falciform   ligament. This is seen  on images 51, 52 and 61.    Dr. Devona Konig was paged with this report at 11:30 a.m. on 31 January 2011.          Verified By: DAVID A. Martinique, M.D., MD   Assessment/Plan:  Assessment/Plan:   Assessment 1) failure to thrive, s/p peg. 2) h/o traumatic brain injury 3) history of aspiration/sepsis syndrome.    Plan 1) mild increase of temperature and wbc noted today, will recheck vitals and am hemogram.   Electronic Signatures: Loistine Simas (MD)  (Signed 06-Feb-13 20:53)  Authored: Chief Complaint, VITAL SIGNS/ANCILLARY NOTES, Brief Assessment, Lab Results, Radiology Results, Assessment/Plan   Last Updated: 06-Feb-13 20:53 by Loistine Simas (MD)

## 2015-04-20 NOTE — Consult Note (Signed)
Chief Complaint:   Subjective/Chief Complaint about the same non-verbal   VITAL SIGNS/ANCILLARY NOTES: **Vital Signs.:   06-Feb-13 05:15   Vital Signs Type Routine   Temperature Temperature (F) 98.7   Celsius 37   Temperature Source oral   Pulse Pulse 60   Pulse source per Dinamap   Respirations Respirations 20   Systolic BP Systolic BP 342   Diastolic BP (mmHg) Diastolic BP (mmHg) 62   Mean BP 78   BP Source Dinamap   Pulse Ox % Pulse Ox % 86   Pulse Ox Activity Level  At rest   Oxygen Delivery Room Air/ 21 %   Pulse Ox After Adjustment (RN or RCP Only) % 92   Oxygen Delivery Adjusted To (RN or RCP Only)  2L; Nasal Cannula  *Intake and Output.:   Shift 06-Feb-13 15:00   Grand Totals Intake:  720 Output:      Net:  720 24 Hr.:  720   Oral Intake      In:  720   Length of Stay Totals Intake:  87681 Output:  15726    Net:  29189   Brief Assessment:   Cardiac Regular  -- LE edema  -- JVD    Respiratory normal resp effort  clear BS  no use of accessory muscles    Gastrointestinal details normal Soft  Bowel sounds normal  No organomegaly   Routine Hem:  06-Feb-13 08:43    WBC (CBC) 10.3   RBC (CBC) 4.38   Hemoglobin (CBC) 13.8   Hematocrit (CBC) 40.9   Platelet Count (CBC) 187   MCV 93   MCH 31.5   MCHC 33.7   RDW 16.4  Routine Chem:  06-Feb-13 08:43    Glucose, Serum 132   BUN 15   Creatinine (comp) 0.81   Sodium, Serum 143   Potassium, Serum 3.9   Chloride, Serum 103   CO2, Serum 30   Calcium (Total), Serum 9.1   Osmolality (calc) 288   eGFR (African American) >60   eGFR (Non-African American) >60   Anion Gap 10  Routine Hem:  06-Feb-13 08:43    Neutrophil % 77.3   Lymphocyte % 15.9   Monocyte % 5.6   Eosinophil % 0.2   Basophil % 1.0   Neutrophil # 8.0   Lymphocyte # 1.6   Monocyte # 0.6   Eosinophil # 0.0   Basophil # 0.1   Radiology Results: CT:    05-Feb-13 11:08, CT Chest With Contrast   CT Chest With Contrast    REASON FOR EXAM:     f/u atelectasis  COMMENTS:       PROCEDURE: CT  - CT CHEST WITH CONTRAST  - Feb 01 2012 11:08AM     RESULT: Axial CT scanning was performed through the chest with   reconstructions at 5 mm intervals and slice thicknesses following  intravenous administration of 75 cc of Isovue-370 Comparison is made to   the study of 07 January 2012. Review of multiplanar reconstructed images   was performed separately on the VIA monitor.    Since the previous study there has been considerable improvement in the   appearance of the right lower lobe posteriorly. Minimally increased   interstitial density persists. There is a small amount of pleural   thickening posteriorly with a trace of fluid but overall the appearance     of the processin the right lower hemithorax has improved. The left lung   exhibits no  acute abnormality. There are emphysematous changes   bilaterally. There is a stable appearing approximately 3 mm diameter very   faint nodule in subpleural location in the right lower lobe on image  36.    At mediastinal window settings the cardiac chambers are normal in size.   The caliber of the thoracic aorta is normal. I see no bulky mediastinal   or hilar lymph nodes, but there is persistent mild soft tissue fullness   in the right hilum. There is likely intraluminal fluid in the right   mainstem bronchus layering posteriorly suggesting secretions. This is   demonstrated best on images 26 through 32.     Within the upper abdomen the observed portions of the liver arenormal.   There is a tiny amount of air present which appears to lie just under the     anterior aspect of the right hemidiaphragm along the course of the   falciform ligament and the anterior surface of the liver. This is   suspicious for pneumoperitoneum.    IMPRESSION:   1. There has been improvement in the process in the right lower lobe   posteriorly. Soft tissue fullness in the right hilum is present seen on   images  30 and 34 which will merit continued followup. There remain   emphysematous changes in both lungs.  2. There is fluid in the right mainstem bronchus consistent with   secretions.  3. There may be a tiny amount of pneumoperitoneum in the right upper   quadrant along the anterior aspect of the liver along the falciform   ligament. This is seen on images 51, 52 and 61.    Dr. Devona Konig was paged with this report at 11:30 a.m. on 31 January 2011.          Verified By: DAVID A. Martinique, M.D., MD   Assessment/Plan:  Assessment/Plan:   Assessment 1. Atelecatsis-Ct scan seems somewhat better he does have some hilar fullness noted and will need to be followed. Will hold off on bronch at this time. Aspiration preautions   Electronic Signatures: Allyne Gee (MD)  (Signed 06-Feb-13 13:15)  Authored: Chief Complaint, VITAL SIGNS/ANCILLARY NOTES, Brief Assessment, Lab Results, Radiology Results, Assessment/Plan   Last Updated: 06-Feb-13 13:15 by Allyne Gee (MD)
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
## 2015-04-20 NOTE — Consult Note (Signed)
Chief Complaint:   Subjective/Chief Complaint delay on peg today due to scheduling conflicts.  peg FOR  730 am.  Case discussed with surgery medicine and anesthesia again.  patient stable.   VITAL SIGNS/ANCILLARY NOTES: **Vital Signs.:   04-Feb-13 05:05   Vital Signs Type Routine   Temperature Temperature (F) 98   Celsius 36.6   Temperature Source oral   Pulse Pulse 53   Pulse source per Dinamap   Respirations Respirations 18   Systolic BP Systolic BP 91   Diastolic BP (mmHg) Diastolic BP (mmHg) 58   Mean BP 69   BP Source Dinamap   Pulse Ox % Pulse Ox % 98   Pulse Ox Activity Level  At rest   Oxygen Delivery Room Air/ 21 %    14:13   Vital Signs Type Routine   Temperature Temperature (F) 98.1   Celsius 36.7   Temperature Source oral   Pulse Pulse 52   Respirations Respirations 18   Systolic BP Systolic BP 95   Diastolic BP (mmHg) Diastolic BP (mmHg) 58   Mean BP 70   BP Source Dinamap   Pulse Ox % Pulse Ox % 98   Pulse Ox Activity Level  At rest   Oxygen Delivery Room Air/ 21 %  *Intake and Output.:   04-Feb-13 02:30   Stool  Medium soft formed BM   Brief Assessment:   Cardiac Regular    Respiratory clear BS    Gastrointestinal details normal Soft  Nontender  Nondistended  No masses palpable  Bowel sounds normal   Routine Hem:  04-Feb-13 05:18    WBC (CBC) 5.8   RBC (CBC) 3.72   Hemoglobin (CBC) 11.6   Hematocrit (CBC) 35.0   Platelet Count (CBC) 172   MCV 94   MCH 31.2   MCHC 33.1   RDW 18.2  Routine Chem:  04-Feb-13 05:18    Glucose, Serum 81   BUN 16   Creatinine (comp) 0.65   Sodium, Serum 145   Potassium, Serum 4.3   Chloride, Serum 106   CO2, Serum 28   Calcium (Total), Serum 8.8   Osmolality (calc) 289   eGFR (African American) >60   eGFR (Non-African American) >60   Anion Gap 11  Routine Hem:  04-Feb-13 05:18    Neutrophil % 38.7   Lymphocyte % 52.8   Monocyte % 7.4   Eosinophil % 0.7   Basophil % 0.4   Neutrophil # 2.2    Lymphocyte # 3.0   Monocyte # 0.4   Eosinophil # 0.0   Basophil # 0.0   Assessment/Plan:  Assessment/Plan:   Assessment 1) adult failure to thrive.  PEG for tomorrow am.    Plan as above.   Electronic Signatures: Loistine Simas (MD)  (Signed 04-Feb-13 14:35)  Authored: Chief Complaint, VITAL SIGNS/ANCILLARY NOTES, Brief Assessment, Lab Results, Assessment/Plan   Last Updated: 04-Feb-13 14:35 by Loistine Simas (MD)
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
## 2015-04-20 NOTE — Consult Note (Signed)
Chief Complaint:   Subjective/Chief Complaint I called patient's wife to verify consent for PEG.  She was under the impression that patietn was to have pacemaker placed.  Case also discussed with anesthesia and Dr Ellsworth Lennoxejan Sie.  Will await opinion from Dr Harl BowieMassoud who is to see patient re pacemaker.  Will hold plans for PEG again until decision for pacemaker resolved.  Discussed with Dr Michela PitcherEly.   Electronic Signatures: Barnetta ChapelSkulskie, Martin (MD)  (Signed 31-Jan-13 14:42)  Authored: Chief Complaint   Last Updated: 31-Jan-13 14:42 by Barnetta ChapelSkulskie, Martin (MD)

## 2015-04-20 NOTE — Op Note (Signed)
PATIENT NAME:  [name], [name] MR#:  [id number] DATE OF BIRTH:  [birth year]  DATE OF PROCEDURE:  02/01/2012  PREOPERATIVE DIAGNOSIS: Failure to thrive.   POSTOPERATIVE DIAGNOSIS: Failure to thrive.   PROCEDURE: Percutaneous endoscopic gastrostomy tube placement.   SURGEON: Sheresa Cullop E. Excell Seltzerooper, MD  CO-SURGEON: Dr. Marva PandaSkulskie   INDICATIONS: This patient has failure to thrive and requires PEG placement for nutrition and possible aspiration. Preoperatively Dr. Marva PandaSkulskie obtained consent for this noncommunicative patient.   FINDINGS: G-tube placed through previous G-tube site which was heavily scarified.   DESCRIPTION OF PROCEDURE: Patient was under general anesthesia via MAC by anesthesia staff. He was identified and then prepped and draped in Chapman sterile fashion. Dr. Marva PandaSkulskie placed the endoscope into the stomach and the area of prior G-tube scarification was noted both visually and by palpation transcutaneously.  See Dr. Reyes IvanSkulskie's dictation for details of endoscopy.   Noting the location of the scar, an incision was made in the scar and then four quadrant T-type fasteners were placed percutaneously and deployed and then fastened. Through the incision was placed Chapman large-bore needle and Chapman Seldinger wire. Local anesthetic was infiltrated into the skin and subcutaneous tissues around this area and then over the Seldinger wire was placed gradually increasing dilators to dilate up the tract and then over the Seldinger wire was placed the G-tube with the introducer dilator. The balloon was inflated under direct vision with 10 mL of saline and once assuring that function was proper the introducer, dilator and Seldinger wire was removed. The G-tube was capped and then sutured to the bolsters with 2-0 nylon.    Patient tolerated this procedure well. There were no complications. He was left in stable condition under monitored anesthesia care in the endoscopy suite.  ____________________________ Adah Salvageichard E.  Excell Seltzerooper, MD rec:cms D: 02/01/2012 08:24:21 ET T: 02/01/2012 10:12:16 ET JOB#: [id number]  cc: Adah Salvageichard E. Excell Seltzerooper, MD, <Dictator> Lattie HawICHARD E Hartlee Amedee MD ELECTRONICALLY SIGNED 02/03/2012 13:38

## 2015-04-20 NOTE — Consult Note (Signed)
Chief Complaint:   Subjective/Chief Complaint stable, being fed soft diet by wife today.   VITAL SIGNS/ANCILLARY NOTES: **Vital Signs.:   01-Feb-13 06:54   Vital Signs Type Routine   Temperature Temperature (F) 98.3   Celsius 36.8   Temperature Source oral   Pulse Pulse 53   Pulse source per Dinamap   Respirations Respirations 18   Systolic BP Systolic BP 034   Diastolic BP (mmHg) Diastolic BP (mmHg) 75   Mean BP 85   BP Source Dinamap   Pulse Ox % Pulse Ox % 95   Pulse Ox Activity Level  At rest   Oxygen Delivery Room Air/ 21 %   Brief Assessment:   Cardiac Regular    Respiratory clear BS    Gastrointestinal details normal Soft  Nontender  Nondistended  No masses palpable  Bowel sounds normal   Routine Hem:  01-Feb-13 06:32    WBC (CBC) 5.8   RBC (CBC) 3.79   Hemoglobin (CBC) 11.7   Hematocrit (CBC) 35.5   Platelet Count (CBC) 172   MCV 94   MCH 30.9   MCHC 33.0   RDW 17.5  Routine Chem:  01-Feb-13 06:32    Glucose, Serum 74   BUN 9   Creatinine (comp) 0.73   Sodium, Serum 140   Potassium, Serum 4.0   Chloride, Serum 104   CO2, Serum 30   Calcium (Total), Serum 8.9   Osmolality (calc) 277   eGFR (African American) >60   eGFR (Non-African American) >60   Anion Gap 6  Routine Hem:  01-Feb-13 06:32    Neutrophil % 41.1   Lymphocyte % 50.7   Monocyte % 6.2   Eosinophil % 1.3   Basophil % 0.7   Neutrophil # 2.4   Lymphocyte # 2.9   Monocyte # 0.4   Eosinophil # 0.1   Basophil # 0.0   Assessment/Plan:  Assessment/Plan:   Assessment 1) failure to thrive, risk for aspiration 2) bradycardia, hypotension resolved, appreciate cardiology note.   3) h/o traumatic brain injury    Plan 1) I again discussed PEG with patients wife.  She wishes to proceed with peg.  Unable to do today due to him having had some solidd food today.  Will place on schedule for egd/peg for monday.  I have discussed the risks benefits and complications of egd / peg with patients  wife and she wishes to proceed.  I have also informed wife that peg does not eliminate the chance of aspiration, but may improve it.  Dr Vira Agar on call over the weekend if needed, I will be back on monday.  Discussed with Dr Elijio Miles.   Electronic Signatures: Loistine Simas (MD)  (Signed 01-Feb-13 12:42)  Authored: Chief Complaint, VITAL SIGNS/ANCILLARY NOTES, Brief Assessment, Lab Results, Assessment/Plan   Last Updated: 01-Feb-13 12:42 by Loistine Simas (MD)

## 2015-04-20 NOTE — Consult Note (Signed)
Comments   I met with pt's wife. She is having a very difficult time with decisions she is confronting, eg PEG and now PPM. She says that pt would never want this but at the same time says she doesn't want to lose him. Pt's sister, who is very involved in pt's care and very supportive of wife, will be here this afternoon. Wife wants to discuss with her and have me give her options, eg, comfort care vs continued aggressive medical therapy. Will follow up.  Electronic Signatures: Artavis Cowie, Izora Gala (MD)  (Signed 23-Jan-13 13:26)  Authored: Palliative Care   Last Updated: 23-Jan-13 13:26 by Tiburcio Linder, Izora Gala (MD)
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
## 2018-02-04 ENCOUNTER — Encounter (HOSPITAL_COMMUNITY): Payer: Self-pay

## 2018-02-04 ENCOUNTER — Observation Stay (HOSPITAL_COMMUNITY)
Admission: EM | Admit: 2018-02-04 | Discharge: 2018-02-05 | Disposition: A | Discharge status: Skilled Nursing Facility | Payer: Medicaid Other | Attending: Internal Medicine | Admitting: Internal Medicine

## 2018-02-04 ENCOUNTER — Other Ambulatory Visit: Payer: Self-pay

## 2018-02-04 ENCOUNTER — Emergency Department (HOSPITAL_COMMUNITY): Payer: Medicaid Other

## 2018-02-04 DIAGNOSIS — R05 Cough: Secondary | ICD-10-CM | POA: Diagnosis present

## 2018-02-04 DIAGNOSIS — J111 Influenza due to unidentified influenza virus with other respiratory manifestations: Secondary | ICD-10-CM

## 2018-02-04 DIAGNOSIS — F0390 Unspecified dementia without behavioral disturbance: Secondary | ICD-10-CM | POA: Insufficient documentation

## 2018-02-04 DIAGNOSIS — Z87820 Personal history of traumatic brain injury: Secondary | ICD-10-CM | POA: Insufficient documentation

## 2018-02-04 DIAGNOSIS — Z79899 Other long term (current) drug therapy: Secondary | ICD-10-CM | POA: Insufficient documentation

## 2018-02-04 DIAGNOSIS — J690 Pneumonitis due to inhalation of food and vomit: Secondary | ICD-10-CM

## 2018-02-04 DIAGNOSIS — F329 Major depressive disorder, single episode, unspecified: Secondary | ICD-10-CM | POA: Insufficient documentation

## 2018-02-04 DIAGNOSIS — S069X9A Unspecified intracranial injury with loss of consciousness of unspecified duration, initial encounter: Secondary | ICD-10-CM | POA: Diagnosis present

## 2018-02-04 DIAGNOSIS — J101 Influenza due to other identified influenza virus with other respiratory manifestations: Secondary | ICD-10-CM | POA: Diagnosis not present

## 2018-02-04 DIAGNOSIS — S069XAA Unspecified intracranial injury with loss of consciousness status unknown, initial encounter: Secondary | ICD-10-CM | POA: Diagnosis present

## 2018-02-04 HISTORY — DX: Unspecified dementia, unspecified severity, without behavioral disturbance, psychotic disturbance, mood disturbance, and anxiety: F03.90

## 2018-02-04 HISTORY — DX: Depression, unspecified: F32.A

## 2018-02-04 HISTORY — DX: Unspecified intracranial injury with loss of consciousness status unknown, initial encounter: S06.9XAA

## 2018-02-04 HISTORY — DX: Major depressive disorder, single episode, unspecified: F32.9

## 2018-02-04 HISTORY — DX: Unspecified intracranial injury with loss of consciousness of unspecified duration, initial encounter: S06.9X9A

## 2018-02-04 LAB — CBC WITH DIFFERENTIAL/PLATELET
BASOS ABS: 0 10*3/uL (ref 0.0–0.1)
BASOS PCT: 0 %
EOS PCT: 0 %
Eosinophils Absolute: 0 10*3/uL (ref 0.0–0.7)
HEMATOCRIT: 39.5 % (ref 39.0–52.0)
Hemoglobin: 13.3 g/dL (ref 13.0–17.0)
Lymphocytes Relative: 13 %
Lymphs Abs: 0.9 10*3/uL (ref 0.7–4.0)
MCH: 30.4 pg (ref 26.0–34.0)
MCHC: 33.7 g/dL (ref 30.0–36.0)
MCV: 90.4 fL (ref 78.0–100.0)
MONO ABS: 1 10*3/uL (ref 0.1–1.0)
MONOS PCT: 15 %
NEUTROS ABS: 4.8 10*3/uL (ref 1.7–7.7)
Neutrophils Relative %: 72 %
Platelets: 127 10*3/uL — ABNORMAL LOW (ref 150–400)
RBC: 4.37 MIL/uL (ref 4.22–5.81)
RDW: 13.4 % (ref 11.5–15.5)
WBC: 6.7 10*3/uL (ref 4.0–10.5)

## 2018-02-04 LAB — I-STAT TROPONIN, ED: TROPONIN I, POC: 0 ng/mL (ref 0.00–0.08)

## 2018-02-04 LAB — I-STAT CG4 LACTIC ACID, ED
Lactic Acid, Venous: 2.41 mmol/L (ref 0.5–1.9)
Lactic Acid, Venous: 1.3 mmol/L (ref 0.5–1.9)

## 2018-02-04 LAB — INFLUENZA PANEL BY PCR (TYPE A & B)
INFLBPCR: NEGATIVE
Influenza A By PCR: POSITIVE — AB

## 2018-02-04 LAB — URINALYSIS, ROUTINE W REFLEX MICROSCOPIC
BACTERIA UA: NONE SEEN
Bilirubin Urine: NEGATIVE
Glucose, UA: NEGATIVE mg/dL
Ketones, ur: NEGATIVE mg/dL
Leukocytes, UA: NEGATIVE
NITRITE: NEGATIVE
PH: 5 (ref 5.0–8.0)
Protein, ur: 30 mg/dL — AB
SPECIFIC GRAVITY, URINE: 1.033 — AB (ref 1.005–1.030)

## 2018-02-04 LAB — COMPREHENSIVE METABOLIC PANEL
ALBUMIN: 4.1 g/dL (ref 3.5–5.0)
ALK PHOS: 60 U/L (ref 38–126)
ALT: 16 U/L — AB (ref 17–63)
ANION GAP: 8 (ref 5–15)
AST: 25 U/L (ref 15–41)
BILIRUBIN TOTAL: 0.5 mg/dL (ref 0.3–1.2)
BUN: 20 mg/dL (ref 6–20)
CALCIUM: 9.1 mg/dL (ref 8.9–10.3)
CO2: 24 mmol/L (ref 22–32)
CREATININE: 1.08 mg/dL (ref 0.61–1.24)
Chloride: 106 mmol/L (ref 101–111)
GFR calc Af Amer: >60 mL/min (ref >60)
GFR calc non Af Amer: >60 mL/min (ref >60)
GLUCOSE: 124 mg/dL — AB (ref 65–99)
Potassium: 3.6 mmol/L (ref 3.5–5.1)
SODIUM: 138 mmol/L (ref 135–145)
TOTAL PROTEIN: 7.2 g/dL (ref 6.5–8.1)

## 2018-02-04 MED ORDER — VANCOMYCIN HCL IN DEXTROSE 750-5 MG/150ML-% IV SOLN
750.0000 mg | Freq: Once | INTRAVENOUS | Status: AC
  Administered 2018-02-04: 750 mg via INTRAVENOUS
  Filled 2018-02-04: qty 150

## 2018-02-04 MED ORDER — SODIUM CHLORIDE 0.9 % IV BOLUS (SEPSIS)
1000.0000 mL | Freq: Once | INTRAVENOUS | Status: AC
Start: 2018-02-04 — End: 2018-02-04
  Administered 2018-02-04: 1000 mL via INTRAVENOUS

## 2018-02-04 MED ORDER — DEXTROSE 5 % IV SOLN
2.0000 g | Freq: Once | INTRAVENOUS | Status: AC
  Administered 2018-02-04: 2 g via INTRAVENOUS
  Filled 2018-02-04: qty 2

## 2018-02-04 MED ORDER — VANCOMYCIN HCL IN DEXTROSE 1-5 GM/200ML-% IV SOLN
1000.0000 mg | Freq: Once | INTRAVENOUS | Status: AC
Start: 2018-02-04 — End: 2018-02-04
  Administered 2018-02-04: 1000 mg via INTRAVENOUS
  Filled 2018-02-04: qty 200

## 2018-02-04 MED ORDER — SODIUM CHLORIDE 0.9 % IV BOLUS (SEPSIS)
1000.0000 mL | Freq: Once | INTRAVENOUS | Status: AC
  Administered 2018-02-04: 1000 mL via INTRAVENOUS

## 2018-02-04 MED ORDER — OSELTAMIVIR PHOSPHATE 75 MG PO CAPS
75.0000 mg | ORAL_CAPSULE | Freq: Two times a day (BID) | ORAL | Status: DC
  Administered 2018-02-04 – 2018-02-05 (×2): 75 mg via ORAL
  Filled 2018-02-04 (×3): qty 1

## 2018-02-04 MED ORDER — SODIUM CHLORIDE 0.9 % IV BOLUS (SEPSIS)
500.0000 mL | Freq: Once | INTRAVENOUS | Status: AC
  Administered 2018-02-04: 500 mL via INTRAVENOUS

## 2018-02-04 NOTE — ED Triage Notes (Signed)
Tylenol 1000 mg 1954 [facility] is always non verbal.

## 2018-02-04 NOTE — Progress Notes (Signed)
A consult was received from an ED physician for Vancomycin and Cefepime per pharmacy dosing.  The patient's profile has been reviewed for ht/wt/allergies/indication/available labs.   A one time order has been placed by provider for Cefepime 2gm and Vancomycin 1gm. Will enter an additional order for Vancomycin 750mg  IV x 1 to be given at the conclusion of the Vancomycin 1gm infusion for a total loading dose of 1750mg  (20 mg/kg).   Further antibiotics/pharmacy consults should be ordered by admitting physician if indicated.                       Thank you, Maryellen PilePoindexter, Murad Staples Trefz, PharmD 02/04/2018  8:57 PM

## 2018-02-04 NOTE — ED Notes (Signed)
Pt has a condom cath on due to incontinence.
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
## 2018-02-04 NOTE — ED Notes (Signed)
Bed: [id number] Expected date:  Expected time:  Means of arrival:  Comments: 689m AMS

## 2018-02-05 DIAGNOSIS — S069X6D Unspecified intracranial injury with loss of consciousness greater than 24 hours without return to pre-existing conscious level with patient surviving, subsequent encounter: Secondary | ICD-10-CM | POA: Diagnosis not present

## 2018-02-05 DIAGNOSIS — S069XAA Unspecified intracranial injury with loss of consciousness status unknown, initial encounter: Secondary | ICD-10-CM | POA: Diagnosis present

## 2018-02-05 DIAGNOSIS — J690 Pneumonitis due to inhalation of food and vomit: Secondary | ICD-10-CM | POA: Diagnosis not present

## 2018-02-05 DIAGNOSIS — J101 Influenza due to other identified influenza virus with other respiratory manifestations: Secondary | ICD-10-CM | POA: Diagnosis not present

## 2018-02-05 DIAGNOSIS — S069X9A Unspecified intracranial injury with loss of consciousness of unspecified duration, initial encounter: Secondary | ICD-10-CM | POA: Diagnosis present

## 2018-02-05 LAB — BASIC METABOLIC PANEL
ANION GAP: 5 (ref 5–15)
BUN: 14 mg/dL (ref 6–20)
CALCIUM: 7.7 mg/dL — AB (ref 8.9–10.3)
CO2: 25 mmol/L (ref 22–32)
CREATININE: 0.89 mg/dL (ref 0.61–1.24)
Chloride: 105 mmol/L (ref 101–111)
GLUCOSE: 112 mg/dL — AB (ref 65–99)
Potassium: 3.7 mmol/L (ref 3.5–5.1)
Sodium: 135 mmol/L (ref 135–145)

## 2018-02-05 LAB — CBC
HCT: 36.7 % — ABNORMAL LOW (ref 39.0–52.0)
Hemoglobin: 12.3 g/dL — ABNORMAL LOW (ref 13.0–17.0)
MCH: 30.5 pg (ref 26.0–34.0)
MCHC: 33.5 g/dL (ref 30.0–36.0)
MCV: 91.1 fL (ref 78.0–100.0)
PLATELETS: 123 10*3/uL — AB (ref 150–400)
RBC: 4.03 MIL/uL — ABNORMAL LOW (ref 4.22–5.81)
RDW: 13.7 % (ref 11.5–15.5)
WBC: 7.3 10*3/uL (ref 4.0–10.5)

## 2018-02-05 LAB — HIV ANTIBODY (ROUTINE TESTING W REFLEX): HIV SCREEN 4TH GENERATION: NONREACTIVE

## 2018-02-05 MED ORDER — GUAIFENESIN 100 MG/5ML PO SOLN: 200.0000 mg | Freq: Four times a day (QID) | ORAL | Status: DC | PRN

## 2018-02-05 MED ORDER — ONDANSETRON HCL 4 MG/2ML IJ SOLN: 4.0000 mg | Freq: Four times a day (QID) | INTRAMUSCULAR | Status: DC | PRN

## 2018-02-05 MED ORDER — MIRTAZAPINE 30 MG PO TABS
15.0000 mg | ORAL_TABLET | Freq: Every day | ORAL | Status: DC
  Administered 2018-02-05: 15 mg via ORAL
  Filled 2018-02-05: qty 0.5
  Filled 2018-02-05: qty 1
  Filled 2018-02-05: qty 0.5

## 2018-02-05 MED ORDER — HYDROCERIN EX CREA
1.0000 "application " | TOPICAL_CREAM | Freq: Every day | CUTANEOUS | Status: DC
  Administered 2018-02-05: 1 via TOPICAL
  Filled 2018-02-05: qty 113

## 2018-02-05 MED ORDER — CHLORHEXIDINE GLUCONATE 0.12 % MT SOLN
15.0000 mL | Freq: Two times a day (BID) | OROMUCOSAL | Status: DC
  Administered 2018-02-05 (×2): 15 mL via OROMUCOSAL
  Filled 2018-02-05 (×2): qty 15

## 2018-02-05 MED ORDER — PAROXETINE HCL 20 MG PO TABS
20.0000 mg | ORAL_TABLET | Freq: Every day | ORAL | Status: DC
  Administered 2018-02-05: 20 mg via ORAL
  Filled 2018-02-05: qty 1

## 2018-02-05 MED ORDER — OSELTAMIVIR PHOSPHATE 75 MG PO CAPS: 75.0000 mg | ORAL_CAPSULE | Freq: Two times a day (BID) | ORAL | 0 refills | Status: DC

## 2018-02-05 MED ORDER — ACETAMINOPHEN 500 MG PO TABS: 500.0000 mg | ORAL_TABLET | ORAL | Status: DC | PRN

## 2018-02-05 MED ORDER — CLONAZEPAM 0.5 MG PO TABS
0.5000 mg | ORAL_TABLET | Freq: Two times a day (BID) | ORAL | Status: DC
  Administered 2018-02-05: 0.5 mg via ORAL
  Filled 2018-02-05: qty 1

## 2018-02-05 MED ORDER — RIVASTIGMINE 9.5 MG/24HR TD PT24
9.5000 mg | MEDICATED_PATCH | Freq: Every day | TRANSDERMAL | Status: DC
  Administered 2018-02-05: 9.5 mg via TRANSDERMAL
  Filled 2018-02-05: qty 1

## 2018-02-05 MED ORDER — ENOXAPARIN SODIUM 40 MG/0.4ML ~~LOC~~ SOLN
40.0000 mg | Freq: Every day | SUBCUTANEOUS | Status: DC
  Administered 2018-02-05: 40 mg via SUBCUTANEOUS
  Filled 2018-02-05 (×2): qty 0.4

## 2018-02-05 MED ORDER — ALUM & MAG HYDROXIDE-SIMETH 200-200-20 MG/5ML PO SUSP: 30.0000 mL | Freq: Four times a day (QID) | ORAL | Status: DC | PRN

## 2018-02-05 MED ORDER — LOPERAMIDE HCL 2 MG PO CAPS: 2.0000 mg | ORAL_CAPSULE | ORAL | Status: DC | PRN

## 2018-02-05 MED ORDER — QUETIAPINE FUMARATE 25 MG PO TABS
50.0000 mg | ORAL_TABLET | Freq: Every day | ORAL | Status: DC
  Administered 2018-02-05: 50 mg via ORAL
  Filled 2018-02-05: qty 2

## 2018-02-05 MED ORDER — IBUPROFEN 800 MG PO TABS: 800.0000 mg | ORAL_TABLET | Freq: Four times a day (QID) | ORAL | Status: DC | PRN

## 2018-02-05 MED ORDER — ONDANSETRON HCL 4 MG PO TABS: 4.0000 mg | ORAL_TABLET | Freq: Four times a day (QID) | ORAL | Status: DC | PRN

## 2018-02-05 MED ORDER — TRAZODONE HCL 100 MG PO TABS
100.0000 mg | ORAL_TABLET | Freq: Every day | ORAL | Status: DC
  Administered 2018-02-05: 100 mg via ORAL
  Filled 2018-02-05: qty 1

## 2018-02-05 NOTE — Social Work (Addendum)
Clinical Social Worker facilitated patient discharge including contacting patient family and facility to confirm patient discharge plans.  Clinical information faxed to facility.  CSW was able to reach [name] at [phone] and she was in agreement with return to facility. Pt long term resident at ALF.  CS[facility]   RN to call [phone] to give report prior to discharge.  Clinical Social Worker will sign off for now as social work intervention is no longer needed. Please consult us again if new need arises.  Keene BreathPatricia Jaleigha Deane, LCSW Clinical Social Worker-Weekend [facility] [phone]
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
## 2018-02-05 NOTE — Progress Notes (Signed)
[name]. IV's x 2 removed from pt and belongings packed and ready for transport.

## 2018-02-05 NOTE — [place] FL2 (Signed)
[place] MEDICAID FL2 LEVEL OF CARE SCREENING TOOL     IDENTIFICATION  Patient Name: [name] Birthdate: Mar 31, 1961 Sex: male Admission Date (Current Location): 02/04/2018  [facility] and [place] Number:  Producer, television/film/video and Address:  [facility],  [address] [place] [address]      Provider Number: [id number]  Attending Physician Name and Address:  Delaine Lame, MD  Relative Name and Phone Number:  Sylvan Sookdeo, spouse,     Current Level of Care: Hospital Recommended Level of Care: Assisted Living Facility, Memory Care Prior Approval Number:    Date Approved/Denied: 02/05/18 PASRR Number: ALF  Discharge Plan: Other (Comment)([facility], [facility] care)    Current Diagnoses: Patient Active Problem List   Diagnosis Date Noted  . TBI (traumatic brain injury) (HCC) 02/05/2018  . Influenza A 02/05/2018    Orientation RESPIRATION BLADDER Height & Weight     Self  Normal Incontinent Weight: 180 lb (81.6 kg) Height:  6\' 1"  (185.4 cm)  BEHAVIORAL SYMPTOMS/MOOD NEUROLOGICAL BOWEL NUTRITION STATUS      Continent Diet(See DC Summary)  AMBULATORY STATUS COMMUNICATION OF NEEDS Skin   Extensive Assist Verbally Normal                       Personal Care Assistance Level of Assistance  Dressing, Bathing, Feeding Bathing Assistance: Maximum assistance Feeding assistance: Limited assistance Dressing Assistance: Maximum assistance     Functional Limitations Info  Sight, Hearing, Speech Sight Info: Adequate Hearing Info: Adequate Speech Info: Impaired    SPECIAL CARE FACTORS FREQUENCY                       Contractures      Additional Factors Info  Code Status, Allergies, Psychotropic, Isolation Precautions Code Status Info: Full Allergies Info: NIACIN AND RELATED  Psychotropic Info: Remeron, Seroquel, Paxil   Isolation Precautions Info: Droplet Precaution     Current Medications (02/05/2018):  This is the  current hospital active medication list Current Facility-Administered Medications  Medication Dose Route Frequency Provider Last Rate Last Dose  . acetaminophen (TYLENOL) tablet 500 mg  500 mg Oral Q4H PRN Hillary Bow, DO      . alum & mag hydroxide-simeth (MAALOX/MYLANTA) 200-200-20 MG/5ML suspension 30 mL  30 mL Oral Q6H PRN Hillary Bow, DO      . chlorhexidine (PERIDEX) 0.12 % solution 15 mL  15 mL Mouth/Throat BID Lyda Perone M, DO   15 mL at 02/05/18 1417  . clonazePAM (KLONOPIN) tablet 0.5 mg  0.5 mg Oral BID Lyda Perone M, DO   0.5 mg at 02/05/18 1417  . enoxaparin (LOVENOX) injection 40 mg  40 mg Subcutaneous Daily Lyda Perone M, DO   40 mg at 02/05/18 1417  . guaiFENesin (ROBITUSSIN) 100 MG/5ML solution 200 mg  200 mg Oral Q6H PRN Hillary Bow, DO      . hydrocerin (EUCERIN) cream 1 application  1 application Topical Daily Julian Reil, Jared M, DO      . ibuprofen (ADVIL,MOTRIN) tablet 800 mg  800 mg Oral Q6H PRN Hillary Bow, DO      . loperamide (IMODIUM) capsule 2 mg  2 mg Oral PRN Hillary Bow, DO      . mirtazapine (REMERON) tablet 15 mg  15 mg Oral QHS Lyda Perone M, DO   15 mg at 02/05/18 0242  . ondansetron (ZOFRAN) tablet 4 mg  4 mg Oral  Q6H PRN Hillary BowGardner, Jared M, DO       Or  . ondansetron [facility](ZOFRAN) injection 4 mg  4 mg Intravenous Q6H PRN Hillary BowGardner, Jared M, DO      . oseltamivir (TAMIFLU) capsule 75 mg  75 mg Oral BID Charlynne PanderYao, David Hsienta, MD   75 mg at 02/05/18 1417  . PARoxetine (PAXIL) tablet 20 mg  20 mg Oral Daily Lyda PeroneGardner, Jared M, DO   20 mg at 02/05/18 1417  . QUEtiapine (SEROQUEL) tablet 50 mg  50 mg Oral QHS Lyda PeroneGardner, Jared M, DO   50 mg at 02/05/18 0242  . rivastigmine (EXELON) 9.5 mg/24hr 9.5 mg  9.5 mg Transdermal Daily Lyda PeroneGardner, Jared M, DO   9.5 mg at 02/05/18 1418  . traZODone (DESYREL) tablet 100 mg  100 mg Oral QHS Lyda PeroneGardner, Jared M, DO   100 mg at 02/05/18 [id number]     Discharge Medications: Please see discharge summary for a list of  discharge medications.  Relevant Imaging Results:  Relevant Lab Results:   Additional Information SS#: [id number]   Tresa MoorePatricia V Nara Paternoster, LCSW

## 2018-02-05 NOTE — Discharge Instructions (Signed)

## 2018-02-05 NOTE — Progress Notes (Signed)
[name] to be D/C'd Nursing Home per MD order.  Discussed prescriptions and follow up appointments with the patient. Prescriptions given to patient, medication list explained in detail. Pt verbalized understanding.  Allergies as of 02/05/2018      Reactions   Niacin And Related       Medication List    TAKE these medications   acetaminophen 500 MG tablet Commonly known as:  TYLENOL Take 500 mg by mouth every 4 (four) hours as needed for mild pain, fever or headache.   alum & mag hydroxide-simeth 200-200-20 MG/5ML suspension Commonly known as:  MAALOX/MYLANTA Take 30 mLs by mouth every 6 (six) hours as needed for indigestion or heartburn.   chlorhexidine 0.12 % solution Commonly known as:  PERIDEX Use as directed 15 mLs in the mouth or throat 2 (two) times daily.   clonazePAM 0.5 MG tablet Commonly known as:  KLONOPIN Take 0.5 mg by mouth 2 (two) times daily.   eucerin cream Apply 1 application topically daily.   guaifenesin 100 MG/5ML syrup Commonly known as:  ROBITUSSIN Take 200 mg by mouth every 6 (six) hours as needed for cough.   hydrocortisone cream 1 % Apply 1 application topically 2 (two) times daily as needed for itching (apply to arms for flareups).   ibuprofen 800 MG tablet Commonly known as:  ADVIL,MOTRIN Take 800 mg by mouth every 6 (six) hours as needed for headache, mild pain or moderate pain.   loperamide 2 MG capsule Commonly known as:  IMODIUM Take 2 mg by mouth as needed for diarrhea or loose stools. Do not exceed 8 doses/24 hours   magnesium hydroxide 400 MG/5ML suspension Commonly known as:  MILK OF MAGNESIA Take by mouth at bedtime as needed for mild constipation.   mirtazapine 15 MG tablet Commonly known as:  REMERON Take 15 mg by mouth at bedtime.   oseltamivir 75 MG capsule Commonly known as:  TAMIFLU Take 1 capsule (75 mg total) by mouth 2 (two) times daily.   PARoxetine 20 MG tablet Commonly known as:  PAXIL Take 20 mg by  mouth daily.   QUEtiapine 50 MG tablet Commonly known as:  SEROQUEL Take 50 mg by mouth at bedtime.   rivastigmine 9.5 mg/24hr Commonly known as:  EXELON Place 9.5 mg onto the skin daily. Apply 1 topically everyday and change every day *rotate site, remove old patch before placing new*   traZODone 100 MG tablet Commonly known as:  DESYREL Take 100 mg by mouth at bedtime.       Vitals:   02/05/18 0226 02/05/18 0525  BP: 126/60 (!) 108/47  Pulse: 73 73  Resp:  20  Temp: 98.3 F (36.8 C) 98.9 F (37.2 C)  SpO2: 91% 96%    Skin clean, dry and intact without evidence of skin break down, no evidence of skin tears noted. IV catheter discontinued intact. Site without signs and symptoms of complications. Dressing and pressure applied. Pt denies pain at this time. No complaints noted.  An After Visit Summary was printed and given to the patient. Patient escorted via WC, and D/C home via private auto.  [name] BSN, RN [employer] [facility] Phone [phone]

## 2018-02-05 NOTE — ED Notes (Addendum)
ED TO INPATIENT HANDOFF REPORT  Name/Age/Gender [name] 57 y.o. male  Code Status    Code Status Orders  (From admission, onward)        Start     Ordered   02/05/18 0108  Full code  Continuous     02/05/18 0109    Code Status History    Date Active Date Inactive Code Status Order ID Comments User Context   This patient has a current code status but no historical code status.      Home/SNF/Other Boarding Home  Chief Complaint Fever; Altered Mental Status  Level of Care/Admitting Diagnosis ED Disposition    ED Disposition Condition Comment   Admit  Hospital Area: [place] [[id number]]  Level of Care: Med-Surg [16]  Diagnosis: Influenza A [008676]  Admitting Physician: Doreatha Massed  Attending Physician: Etta Quill [[id number]]  PT Class (Do Not Modify): Observation [104]  PT Acc Code (Do Not Modify): Observation [10022]       Medical History Past Medical History:  Diagnosis Date  . Dementia   . Depression   . TBI (traumatic brain injury) ([place])     Allergies Allergies  Allergen Reactions  . Niacin And Related     IV Location/Drains/Wounds Patient Lines/Drains/Airways Status   Active Line/Drains/Airways    Name:   Placement date:   Placement time:   Site:   Days:   Peripheral IV 02/04/18 Left Hand   02/04/18    2050    Hand   1   Peripheral IV 02/04/18 Right Antecubital   02/04/18    2057    Antecubital   1          Labs/Imaging Results for orders placed or performed during the hospital encounter of 02/04/18 (from the past 48 hour(s))  Comprehensive metabolic panel     Status: Abnormal   Collection Time: 02/04/18  8:33 PM  Result Value Ref Range   Sodium 138 135 - 145 mmol/L   Potassium 3.6 3.5 - 5.1 mmol/L   Chloride 106 101 - 111 mmol/L   CO2 24 22 - 32 mmol/L   Glucose, Bld 124 (H) 65 - 99 mg/dL   BUN 20 6 - 20 mg/dL   Creatinine, Ser 1.08 0.61 - 1.24 mg/dL   Calcium 9.1 8.9 - 10.3 mg/dL   Total  Protein 7.2 6.5 - 8.1 g/dL   Albumin 4.1 3.5 - 5.0 g/dL   AST 25 15 - 41 U/L   ALT 16 (L) 17 - 63 U/L   Alkaline Phosphatase 60 38 - 126 U/L   Total Bilirubin 0.5 0.3 - 1.2 mg/dL   GFR calc non Af Amer >60 >60 mL/min   GFR calc Af Amer >60 >60 mL/min    Comment: (NOTE) The eGFR has been calculated using the CKD EPI equation. This calculation has not been validated in all clinical situations. eGFR's persistently <60 mL/min signify possible Chronic Kidney Disease.    Anion gap 8 5 - 15    Comment: Performed at [facility], [place] [address], [place], [place] [address]  CBC WITH DIFFERENTIAL     Status: Abnormal   Collection Time: 02/04/18  8:33 PM  Result Value Ref Range   WBC 6.7 4.0 - 10.5 K/uL   RBC 4.37 4.22 - 5.81 MIL/uL   Hemoglobin 13.3 13.0 - 17.0 g/dL   HCT 39.5 39.0 - 52.0 %   MCV 90.4 78.0 - 100.0 fL  MCH 30.4 26.0 - 34.0 pg   MCHC 33.7 30.0 - 36.0 g/dL   RDW 13.4 11.5 - 15.5 %   Platelets 127 (L) 150 - 400 K/uL   Neutrophils Relative % 72 %   Neutro Abs 4.8 1.7 - 7.7 K/uL   Lymphocytes Relative 13 %   Lymphs Abs 0.9 0.7 - 4.0 K/uL   Monocytes Relative 15 %   Monocytes Absolute 1.0 0.1 - 1.0 K/uL   Eosinophils Relative 0 %   Eosinophils Absolute 0.0 0.0 - 0.7 K/uL   Basophils Relative 0 %   Basophils Absolute 0.0 0.0 - 0.1 K/uL    Comment: Performed at [facility], [place] [address] [place], [place] [address]  Urinalysis, Routine w reflex microscopic     Status: Abnormal   Collection Time: 02/04/18  8:33 PM  Result Value Ref Range   Color, Urine AMBER (A) YELLOW    Comment: BIOCHEMICALS MAY BE AFFECTED BY COLOR   APPearance CLEAR CLEAR   Specific Gravity, Urine 1.033 (H) 1.005 - 1.030   pH 5.0 5.0 - 8.0   Glucose, UA NEGATIVE NEGATIVE mg/dL   Hgb urine dipstick MODERATE (A) NEGATIVE   Bilirubin Urine NEGATIVE NEGATIVE   Ketones, ur NEGATIVE NEGATIVE mg/dL   Protein, ur 30 (A) NEGATIVE mg/dL   Nitrite NEGATIVE NEGATIVE    Leukocytes, UA NEGATIVE NEGATIVE   RBC / HPF TOO NUMEROUS TO COUNT 0 - 5 RBC/hpf   WBC, UA 0-5 0 - 5 WBC/hpf   Bacteria, UA NONE SEEN NONE SEEN   Squamous Epithelial / LPF 0-5 (A) NONE SEEN   Mucus PRESENT     Comment: Performed at [facility], [place] [address], [place], [place] [address]  I-stat troponin, ED (not at [facility], [facility])     Status: None   Collection Time: 02/04/18  8:52 PM  Result Value Ref Range   Troponin i, poc 0.00 0.00 - 0.08 ng/mL   Comment 3            Comment: Due to the release kinetics of cTnI, a negative result within the first hours of the onset of symptoms does not rule out myocardial infarction with certainty. If myocardial infarction is still suspected, repeat the test at appropriate intervals.   I-Stat CG4 Lactic Acid, ED     Status: Abnormal   Collection Time: 02/04/18  8:53 PM  Result Value Ref Range   Lactic Acid, Venous 2.41 (HH) 0.5 - 1.9 mmol/L   Comment NOTIFIED PHYSICIAN   Influenza panel by PCR (type A & B)     Status: Abnormal   Collection Time: 02/04/18  9:15 PM  Result Value Ref Range   Influenza A By PCR POSITIVE (A) NEGATIVE   Influenza B By PCR NEGATIVE NEGATIVE    Comment: (NOTE) The Xpert Xpress Flu assay is intended as an aid in the diagnosis of  influenza and should not be used as a sole basis for treatment.  This  assay is FDA approved for nasopharyngeal swab specimens only. Nasal  washings and aspirates are unacceptable for Xpert Xpress Flu testing. Performed at [facility], [place] [address], [place], [place] [address]   I-Stat CG4 Lactic Acid, ED     Status: None   Collection Time: 02/04/18 11:28 PM  Result Value Ref Range   Lactic Acid, Venous 1.30 0.5 - 1.9 mmol/L   Dg Chest Port 1 View  Result Date: 02/04/2018 CLINICAL DATA:  Altered mental status.  Fever. EXAM: PORTABLE CHEST 1 VIEW COMPARISON:  None. FINDINGS: Cardiac silhouette is normal in size and configuration. No mediastinal or hilar  masses. No evidence of adenopathy. Lungs are clear.  No pleural effusion or pneumothorax. Skeletal structures are intact. IMPRESSION: No active disease. Electronically Signed   By: Lajean Manes M.D.   On: 02/04/2018 21:10    Pending Labs Unresulted Labs (From admission, onward)   Start     Ordered   02/05/18 0500  CBC  Tomorrow morning,   R     02/05/18 0109   02/05/18 1165  Basic metabolic panel  Tomorrow morning,   R     02/05/18 0109   02/05/18 0108  HIV antibody (Routine Testing)  Once,   R     02/05/18 0109   02/04/18 2033  Blood Culture (routine x 2)  BLOOD CULTURE X 2,   STAT     02/04/18 2033   02/04/18 2033  Urine culture  STAT,   STAT     02/04/18 2033      Vitals/Pain Today's Vitals   02/05/18 0000 02/05/18 0030 02/05/18 0100 02/05/18 0114  BP: (!) 104/46 (!) 102/55 96/67 96/67   Pulse: 71 68 74 69  Resp: (!) 31 (!) 33 (!) 30 (!) 25  Temp:      TempSrc:      SpO2: 98% 98% 93% 95%  Weight:      Height:      PainSc:        Isolation Precautions No active isolations  Medications Medications  oseltamivir (TAMIFLU) capsule 75 mg (75 mg Oral Given 02/04/18 2315)  ibuprofen (ADVIL,MOTRIN) tablet 800 mg (not administered)  mirtazapine (REMERON) tablet 15 mg (not administered)  traZODone (DESYREL) tablet 100 mg (not administered)  guaifenesin (ROBITUSSIN) 100 MG/5ML syrup 200 mg (not administered)  acetaminophen (TYLENOL) tablet 500 mg (not administered)  alum & mag hydroxide-simeth (MAALOX/MYLANTA) 200-200-20 MG/5ML suspension 30 mL (not administered)  rivastigmine (EXELON) 9.5 mg/24hr 9.5 mg (not administered)  QUEtiapine (SEROQUEL) tablet 50 mg (not administered)  eucerin cream 1 application (not administered)  chlorhexidine (PERIDEX) 0.12 % solution 15 mL (not administered)  clonazePAM (KLONOPIN) tablet 0.5 mg (not administered)  loperamide (IMODIUM) capsule 2 mg (not administered)  PARoxetine (PAXIL) tablet 20 mg (not administered)  ondansetron (ZOFRAN) tablet  4 mg (not administered)    Or  ondansetron (ZOFRAN) injection 4 mg (not administered)  enoxaparin (LOVENOX) injection 40 mg (not administered)  sodium chloride 0.9 % bolus 1,000 mL (0 mLs Intravenous Stopped 02/04/18 2135)    And  sodium chloride 0.9 % bolus 1,000 mL (0 mLs Intravenous Stopped 02/04/18 2130)    And  sodium chloride 0.9 % bolus 500 mL (0 mLs Intravenous Stopped 02/04/18 2116)  ceFEPIme (MAXIPIME) 2 g in dextrose 5 % 50 mL IVPB (0 g Intravenous Stopped 02/04/18 2136)  vancomycin (VANCOCIN) IVPB 1000 mg/200 mL premix (0 mg Intravenous Stopped 02/04/18 2210)  vancomycin (VANCOCIN) IVPB 750 mg/150 ml premix (0 mg Intravenous Stopped 02/04/18 2334)    Mobility Non ambulatory

## 2018-02-05 NOTE — Social Work (Addendum)
CSW contacted spouse to discuss disposition and phone number in chart is not in service.  CSW called another family member listed on facesheet and left message requesting a call back.  CSW contacted [name] and was advised by [name], staff, that they normally do not take patient back to facility on weekend. CSW validated her concerns and requested that staff f/u with on call supervisor.  CSW sent clinical dc summary for review.    CSW will f/u for return to ALF as it may not be able to happen today.  3:22pm CSW called [facility] and was advised that management has not gotten to them on re-admitt patient. CSW discussed with [name], staff, and sent FL2.  CSW obtained contact# [phone] for spouse [name], CSW left voicemail message.  CSW will continue to follow up.  3:35pm CSW received call from [name] form ALF indicated that FL2 needs to include diagnosis of dementia. CSW indicated that patient came to hospital for a fever and our doctor cannot diagnose dementia. CSW reiterated that patient has diagnosis of traumatic brain injury and that is listed on problem list on FL2. Dean RegalCarol will f/u.  3:41-pm CSW received call from [name],indicating that there needs to be a diagnosis of Dementia on FL2 as patient resides in Memory Care Unit of nursing facility.  CSW validated her concerns and reiterated that dementia is not listed on patient's problem list in hospital and patient was admitted for fever. CSW indicated that our doctor treated the complaints of fever and are not treating dementia and cannot add that diagnosis to problem list without providing that care to patient. [name] indicated that she would need to talk to her administration about this to see if patient will be able to return.  CSW indicated to [name], that CSW can update FL2 to include return to ALF-Memory Care Unit.  CSW will continue to follow for disposition.  3:50pm: CSW received call from  [name]  at ALF indicating that patient can return to ALF today.    CSW left message for spouse and will set up transport as patient resides at ALF.  [name], LCSW Clinical Social Worker-Week[facility] [phone]
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
## 2018-02-05 NOTE — Clinical Social Work Note (Signed)
Clinical Social Work Assessment  Patient Details  Name: [name] MRN: [id number] Date of Birth: Jan 13, 1961  Date of referral:  02/05/18               Reason for consult:  Facility Placement                Permission sought to share information with:  Facility Industrial/product designerContact Representative Permission granted to share information::     Name::     Dean BihariDonna Chapman, spouse  Agency::  [facility]  Relationship::     Contact Information:     Housing/Transportation Living arrangements for the past 2 months:  [facility] Source of Information:  Medical Team, Facility Patient Interpreter Needed:  None Criminal Activity/Legal Involvement Pertinent to Current Situation/Hospitalization:  No - Comment as needed Significant Relationships:  Other Family Members, Spouse Lives with:  Facility Resident Do you feel safe going back to the place where you live?  Yes Need for family participation in patient care:  Yes (Comment)  Care giving concerns:  [facility]. Pt will return to ALF. CSW confirmed with ALF that patient resides and will CSW will f/u on disposition.  CSW contacted spouse and unable to reach. CSW then contacted family member number and left message.  CSW will f/u.   Social Worker assessment / plan:  CSW will assist with [facility].  Employment status:  Disabled (Comment on whether or not currently receiving Disability) Insurance information:  [place] PT Recommendations:  Not assessed at this time Information / Referral to community resources:  Other (Comment Required)(ALF)  Patient/Family's Response to care:  CSW unable to reach family and left message. Facility agreed to take patient back as he is a long term patient that resides in memory care unit.  Patient/Family's Understanding of and Emotional Response to Diagnosis, Current Treatment, and Prognosis:  CSW left message for family about  return to ALF. Floor nurse confirmed that she advised spouse today of return to ALF.  CSW will assist with disposition back to ALF. CSW completed all documentation for return to ALF.    Emotional Assessment Appearance:  Appears stated age Attitude/Demeanor/Rapport:  Unable to Assess Affect (typically observed):  Unable to Assess(Non-verbal) Orientation:  Oriented to Self Alcohol / Substance use:  Not Applicable Psych involvement (Current and /or in the community):  No (Comment)  Discharge Needs  Concerns to be addressed:  Discharge Planning Concerns Readmission within the last 30 days:  No Current discharge risk:  Dependent with Mobility, Physical Impairment Barriers to Discharge:  No Barriers Identified   Tresa Mooreatricia V Giavanni Odonovan, LCSW 02/05/2018, 2:48 PM

## 2018-02-05 NOTE — Plan of Care (Signed)
  Safety: Ability to remain free from injury will improve 02/05/2018 [id number] - Progressing by Antionette CharPeng, Madalaine Portier P, RN

## 2018-02-05 NOTE — NC FL2 (Signed)
[place] MEDICAID FL2 LEVEL OF CARE SCREENING TOOL     IDENTIFICATION  Patient Name: [name] Birthdate: 04/27/1961 Sex: male Admission Date (Current Location): 02/04/2018  [facility] [place]   Facility and Address:  [facility],  [address], [place] [id number]      Provider Number: [id number]  Attending Physician Name and Address:  Delaine LamePurohit, Shrey C, MD  Relative Name and Phone Number:  Wilber BihariDonna Cassis, spouse,     Current Level of Care: Hospital Recommended Level of Care: Assisted Living Facility Prior Approval Number:    Date Approved/Denied: 02/05/18 PASRR Number: ALF  Discharge Plan: Other (Comment)([facility], ALF)    Current Diagnoses: Patient Active Problem List   Diagnosis Date Noted  . TBI (traumatic brain injury) (HCC) 02/05/2018  . Influenza A 02/05/2018    Orientation RESPIRATION BLADDER Height & Weight     Self  Normal Incontinent Weight: 180 lb (81.6 kg) Height:  6\' 1"  (185.4 cm)  BEHAVIORAL SYMPTOMS/MOOD NEUROLOGICAL BOWEL NUTRITION STATUS      Continent Diet(See DC Summary)  AMBULATORY STATUS COMMUNICATION OF NEEDS Skin   Extensive Assist Verbally Normal                       Personal Care Assistance Level of Assistance  Dressing, Bathing, Feeding Bathing Assistance: Maximum assistance Feeding assistance: Limited assistance Dressing Assistance: Maximum assistance     Functional Limitations Info  Sight, Hearing, Speech Sight Info: Adequate Hearing Info: Adequate Speech Info: Impaired    SPECIAL CARE FACTORS FREQUENCY                       Contractures      Additional Factors Info  Code Status, Allergies, Psychotropic, Isolation Precautions Code Status Info: Full Allergies Info: NIACIN AND RELATED  Psychotropic Info: Remeron, Seroquel, Paxil   Isolation Precautions Info: Droplet Precaution     Current Medications (02/05/2018):  This is the current hospital active  medication list Current Facility-Administered Medications  Medication Dose Route Frequency Provider Last Rate Last Dose  . acetaminophen (TYLENOL) tablet 500 mg  500 mg Oral Q4H PRN Hillary BowGardner, Jared M, DO      . alum & mag hydroxide-simeth (MAALOX/MYLANTA) 200-200-20 MG/5ML suspension 30 mL  30 mL Oral Q6H PRN Hillary BowGardner, Jared M, DO      . chlorhexidine (PERIDEX) 0.12 % solution 15 mL  15 mL Mouth/Throat BID Lyda PeroneGardner, Jared M, DO   15 mL at 02/05/18 1417  . clonazePAM (KLONOPIN) tablet 0.5 mg  0.5 mg Oral BID Lyda PeroneGardner, Jared M, DO   0.5 mg at 02/05/18 1417  . enoxaparin (LOVENOX) injection 40 mg  40 mg Subcutaneous Daily Lyda PeroneGardner, Jared M, DO   40 mg at 02/05/18 1417  . guaiFENesin (ROBITUSSIN) 100 MG/5ML solution 200 mg  200 mg Oral Q6H PRN Hillary BowGardner, Jared M, DO      . hydrocerin (EUCERIN) cream 1 application  1 application Topical Daily Julian ReilGardner, Jared M, DO      . ibuprofen (ADVIL,MOTRIN) tablet 800 mg  800 mg Oral Q6H PRN Hillary BowGardner, Jared M, DO      . loperamide (IMODIUM) capsule 2 mg  2 mg Oral PRN Hillary BowGardner, Jared M, DO      . mirtazapine (REMERON) tablet 15 mg  15 mg Oral QHS Lyda PeroneGardner, Jared M, DO   15 mg at 02/05/18 0242  . ondansetron (ZOFRAN) tablet 4 mg  4 mg Oral Q6H PRN Julian ReilGardner,  Heywood Iles, DO       Or  . ondansetron [facility]) injection 4 mg  4 mg Intravenous Q6H PRN Hillary Bow, DO      . oseltamivir (TAMIFLU) capsule 75 mg  75 mg Oral BID Charlynne Pander, MD   75 mg at 02/05/18 1417  . PARoxetine (PAXIL) tablet 20 mg  20 mg Oral Daily Lyda Perone M, DO   20 mg at 02/05/18 1417  . QUEtiapine (SEROQUEL) tablet 50 mg  50 mg Oral QHS Lyda Perone M, DO   50 mg at 02/05/18 0242  . rivastigmine (EXELON) 9.5 mg/24hr 9.5 mg  9.5 mg Transdermal Daily Lyda Perone M, DO   9.5 mg at 02/05/18 1418  . traZODone (DESYREL) tablet 100 mg  100 mg Oral QHS Lyda Perone M, DO   100 mg at 02/05/18 1610     Discharge Medications: Please see discharge summary for a list of discharge  medications.  Relevant Imaging Results:  Relevant Lab Results:   Additional Information SS#: [id number]   Tresa Moore, LCSW

## 2018-02-05 NOTE — Clinical Social Work Placement (Addendum)
   CLINICAL SOCIAL WORK PLACEMENT  NOTE  Date:  02/05/2018  Patient Details  Name: Dean CoyerJeffrey A Bolte MRN: [id number] Date of Birth: 10/21/1961  Clinical Social Work is seeking post-discharge placement for this patient at the Assisted Living Facility level of care (*CSW will initial, date and re-position this form in  chart as items are completed):  Yes   Patient/family provided with [facility] Work Department's list of facilities offering this level of care within the geographic area requested by the patient (or if unable, by the patient's family).  Yes   Patient/family informed of their freedom to choose among providers that offer the needed level of care, that participate in Medicare, Medicaid or managed care program needed by the patient, have an available bed and are willing to accept the patient.  Yes   Patient/family informed of [facility]'s ownership interest in [facility] and [facility], as well as of the fact that they are under no obligation to receive care at these facilities.  PASRR submitted to EDS on       PASRR number received on       Existing PASRR number confirmed on       FL2 transmitted to all facilities in geographic area requested by pt/family on       FL2 transmitted to all facilities within larger geographic area on       Patient informed that his/her managed care company has contracts with or will negotiate with certain facilities, including the following:        Yes   Patient/family informed of bed offers received.  [facility]     Physician recommends and patient chooses bed at      [facility].  Patient to be transferred to facility by [facility]     Patient family notified on 02/05/18 of transfer.  Name of family member notified:  CSW left messages for spouse [name], facility aware and Floor Nurse advised spouse of return to ALF earlier today , 4:35pm, CSW  made contact with spouse who answered phone and confirmed return to ALF.  PHYSICIAN       Additional Comment:    _______________________________________________ Tresa MoorePatricia V Yan Pankratz, LCSW 02/05/2018, 3:57 PM

## 2018-02-06 LAB — URINE CULTURE: CULTURE: NO GROWTH

## 2018-02-10 LAB — CULTURE, BLOOD (ROUTINE X 2)
CULTURE: NO GROWTH
CULTURE: NO GROWTH
Special Requests: ADEQUATE
Special Requests: ADEQUATE

## 2020-03-10 ENCOUNTER — Encounter (HOSPITAL_COMMUNITY): Payer: Self-pay | Admitting: Emergency Medicine

## 2020-03-10 ENCOUNTER — Emergency Department (HOSPITAL_COMMUNITY)
Admission: EM | Admit: 2020-03-10 | Discharge: 2020-03-11 | Disposition: A | Discharge status: Home / Self Care | Payer: Medicaid Other | Attending: Emergency Medicine | Admitting: Emergency Medicine

## 2020-03-10 ENCOUNTER — Emergency Department (HOSPITAL_COMMUNITY): Payer: Medicaid Other

## 2020-03-10 ENCOUNTER — Other Ambulatory Visit: Payer: Self-pay

## 2020-03-10 DIAGNOSIS — Y939 Activity, unspecified: Secondary | ICD-10-CM | POA: Insufficient documentation

## 2020-03-10 DIAGNOSIS — Y999 Unspecified external cause status: Secondary | ICD-10-CM | POA: Insufficient documentation

## 2020-03-10 DIAGNOSIS — Z79899 Other long term (current) drug therapy: Secondary | ICD-10-CM | POA: Insufficient documentation

## 2020-03-10 DIAGNOSIS — F0390 Unspecified dementia without behavioral disturbance: Secondary | ICD-10-CM | POA: Diagnosis not present

## 2020-03-10 DIAGNOSIS — R001 Bradycardia, unspecified: Secondary | ICD-10-CM

## 2020-03-10 DIAGNOSIS — Y929 Unspecified place or not applicable: Secondary | ICD-10-CM | POA: Insufficient documentation

## 2020-03-10 LAB — CBC WITH DIFFERENTIAL/PLATELET
Abs Immature Granulocytes: 0.01 10*3/uL (ref 0.00–0.07)
Basophils Absolute: 0 10*3/uL (ref 0.0–0.1)
Basophils Relative: 0 %
Eosinophils Absolute: 0.2 10*3/uL (ref 0.0–0.5)
Eosinophils Relative: 3 %
HCT: 42.7 % (ref 39.0–52.0)
Hemoglobin: 13.3 g/dL (ref 13.0–17.0)
Immature Granulocytes: 0 %
Lymphocytes Relative: 43 %
Lymphs Abs: 3 10*3/uL (ref 0.7–4.0)
MCH: 30 pg (ref 26.0–34.0)
MCHC: 31.1 g/dL (ref 30.0–36.0)
MCV: 96.2 fL (ref 80.0–100.0)
Monocytes Absolute: 0.4 10*3/uL (ref 0.1–1.0)
Monocytes Relative: 5 %
Neutro Abs: 3.3 10*3/uL (ref 1.7–7.7)
Neutrophils Relative %: 49 %
Platelets: 147 10*3/uL — ABNORMAL LOW (ref 150–400)
RBC: 4.44 MIL/uL (ref 4.22–5.81)
RDW: 12.9 % (ref 11.5–15.5)
WBC: 6.9 10*3/uL (ref 4.0–10.5)
nRBC: 0 % (ref 0.0–0.2)

## 2020-03-10 LAB — COMPREHENSIVE METABOLIC PANEL
ALT: 14 U/L (ref 0–44)
AST: 17 U/L (ref 15–41)
Albumin: 4.5 g/dL (ref 3.5–5.0)
Alkaline Phosphatase: 71 U/L (ref 38–126)
Anion gap: 9 (ref 5–15)
BUN: 15 mg/dL (ref 6–20)
CO2: 28 mmol/L (ref 22–32)
Calcium: 8.8 mg/dL — ABNORMAL LOW (ref 8.9–10.3)
Chloride: 105 mmol/L (ref 98–111)
Creatinine, Ser: 1.03 mg/dL (ref 0.61–1.24)
GFR calc Af Amer: >60 mL/min (ref >60)
GFR calc non Af Amer: >60 mL/min (ref >60)
Glucose, Bld: 89 mg/dL (ref 70–99)
Potassium: 3.6 mmol/L (ref 3.5–5.1)
Sodium: 142 mmol/L (ref 135–145)
Total Bilirubin: 0.5 mg/dL (ref 0.3–1.2)
Total Protein: 7.5 g/dL (ref 6.5–8.1)

## 2020-03-10 LAB — TROPONIN I (HIGH SENSITIVITY): Troponin I (High Sensitivity): 3 ng/L (ref <18)

## 2020-03-10 MED ORDER — SODIUM CHLORIDE 0.9 % IV BOLUS
1000.0000 mL | Freq: Once | INTRAVENOUS | Status: AC
  Administered 2020-03-10: 1000 mL via INTRAVENOUS

## 2020-03-10 MED ORDER — ATROPINE SULFATE 1 MG/ML IJ SOLN
1.0000 mg | Freq: Once | INTRAMUSCULAR | Status: DC
  Filled 2020-03-10 (×2): qty 1

## 2020-03-10 NOTE — ED Notes (Signed)
PT[facility] called for transport back to facility 

## 2020-03-10 NOTE — Discharge Instructions (Addendum)
Decrease your Paxil to 15 mg a day.  Follow-up with your doctor for recheck of your heart rate
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
## 2023-12-31 ENCOUNTER — Encounter (HOSPITAL_COMMUNITY): Payer: Self-pay

## 2023-12-31 ENCOUNTER — Emergency Department (HOSPITAL_COMMUNITY)
Admission: EM | Admit: 2023-12-31 | Discharge: 2023-12-31 | Disposition: A | Discharge status: Home / Self Care | Payer: MEDICAID | Attending: Emergency Medicine | Admitting: Emergency Medicine

## 2023-12-31 ENCOUNTER — Other Ambulatory Visit: Payer: Self-pay

## 2023-12-31 ENCOUNTER — Emergency Department (HOSPITAL_COMMUNITY): Payer: MEDICAID

## 2023-12-31 DIAGNOSIS — R531 Weakness: Secondary | ICD-10-CM | POA: Diagnosis present

## 2023-12-31 DIAGNOSIS — M21331 Wrist drop, right wrist: Secondary | ICD-10-CM | POA: Diagnosis not present

## 2023-12-31 LAB — COMPREHENSIVE METABOLIC PANEL
ALT: 10 U/L (ref 0–44)
AST: 15 U/L (ref 15–41)
Albumin: 4.4 g/dL (ref 3.5–5.0)
Alkaline Phosphatase: 70 U/L (ref 38–126)
Anion gap: 7 (ref 5–15)
BUN: 17 mg/dL (ref 8–23)
CO2: 28 mmol/L (ref 22–32)
Calcium: 9.2 mg/dL (ref 8.9–10.3)
Chloride: 101 mmol/L (ref 98–111)
Creatinine, Ser: 0.66 mg/dL (ref 0.61–1.24)
GFR, Estimated: >60 mL/min (ref >60)
Glucose, Bld: 98 mg/dL (ref 70–99)
Potassium: 3.8 mmol/L (ref 3.5–5.1)
Sodium: 136 mmol/L (ref 135–145)
Total Bilirubin: 0.5 mg/dL (ref 0.0–1.2)
Total Protein: 7.6 g/dL (ref 6.5–8.1)

## 2023-12-31 LAB — CBC
HCT: 41.1 % (ref 39.0–52.0)
Hemoglobin: 13.5 g/dL (ref 13.0–17.0)
MCH: 31.3 pg (ref 26.0–34.0)
MCHC: 32.8 g/dL (ref 30.0–36.0)
MCV: 95.1 fL (ref 80.0–100.0)
Platelets: 147 10*3/uL — ABNORMAL LOW (ref 150–400)
RBC: 4.32 MIL/uL (ref 4.22–5.81)
RDW: 12.6 % (ref 11.5–15.5)
WBC: 7.2 10*3/uL (ref 4.0–10.5)
nRBC: 0 % (ref 0.0–0.2)

## 2023-12-31 LAB — DIFFERENTIAL
Abs Immature Granulocytes: 0.02 10*3/uL (ref 0.00–0.07)
Basophils Absolute: 0 10*3/uL (ref 0.0–0.1)
Basophils Relative: 0 %
Eosinophils Absolute: 0.2 10*3/uL (ref 0.0–0.5)
Eosinophils Relative: 2 %
Immature Granulocytes: 0 %
Lymphocytes Relative: 31 %
Lymphs Abs: 2.2 10*3/uL (ref 0.7–4.0)
Monocytes Absolute: 0.5 10*3/uL (ref 0.1–1.0)
Monocytes Relative: 7 %
Neutro Abs: 4.2 10*3/uL (ref 1.7–7.7)
Neutrophils Relative %: 60 %

## 2023-12-31 LAB — APTT: aPTT: 31 s (ref 24–36)

## 2023-12-31 MED ORDER — IOHEXOL 350 MG/ML SOLN
100.0000 mL | Freq: Once | INTRAVENOUS | Status: AC | PRN
  Administered 2023-12-31: 100 mL via INTRAVENOUS

## 2023-12-31 MED ORDER — IOHEXOL 350 MG/ML SOLN: 75.0000 mL | Freq: Once | INTRAVENOUS | Status: DC | PRN
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
## 2023-12-31 NOTE — Discharge Instructions (Signed)
 As discussed, today's evaluation has been generally reassuring.  However, with your new wrist dysfunction it is important to follow-up with our neurologists.  Return here for concerning changes in your condition.

## 2023-12-31 NOTE — ED Provider Triage Note (Signed)
 Emergency Medicine Provider Triage Evaluation Note  [name] , a 63 y.o. male  was evaluated in triage.  Pt complains of wrist weakness.  Review of Systems  Positive:  Negative:   Physical Exam  BP 122/81   Pulse 61   Temp 97.8 F (36.6 C)   Resp 17   SpO2 100%  Gen:   Awake, no distress   Resp:  Normal effort  MSK:   Moves extremities without difficulty  Other:  Nonverbal, follows command, good eye contact. Bicep/tricep strength on right intact; no flexion or extension of wrist or digits. No swelling or redness. Does not appear painful.   Medical Decision Making  Medically screening exam initiated at 3:18 PM.  Appropriate orders placed.  [name] was informed that the remainder of the evaluation will be completed by another provider, this initial triage assessment does not replace that evaluation, and the importance of remaining in the ED until their evaluation is complete.  Patient sent from [facility] - h/o TBI, dementia. Per staff, normal when he got up this morning. Staff noticed right wrist not moving around 1:15. No other symptoms reported.    Dean Balls, PA-C 12/31/23 1520

## 2023-12-31 NOTE — ED Notes (Signed)
 Discharge instructions reviewed with family / emergency contact.   Opportunity for questions and concerns provided.   Alert, and oriented to baseline. Displays no signs of distress.   Numerous attempts to reach facility by RN, and family unsuccessful.   Family wish to transport patient to facility instead of waiting for ambulance. Family prove to be capable and provide safe measures for transportation.   Declines discharge vital signs.
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
## 2024-04-09 ENCOUNTER — Ambulatory Visit: Payer: MEDICAID | Admitting: Neurology

## 2024-06-26 ENCOUNTER — Emergency Department (HOSPITAL_COMMUNITY): Payer: MEDICAID

## 2024-06-26 ENCOUNTER — Other Ambulatory Visit: Payer: Self-pay

## 2024-06-26 ENCOUNTER — Inpatient Hospital Stay (HOSPITAL_COMMUNITY)
Admission: EM | Admit: 2024-06-26 | Discharge: 2024-06-30 | DRG: 871 | Disposition: A | Discharge status: Skilled Nursing Facility | Payer: MEDICAID | Source: Skilled Nursing Facility | Attending: Internal Medicine | Admitting: Internal Medicine

## 2024-06-26 ENCOUNTER — Encounter (HOSPITAL_COMMUNITY): Payer: Self-pay

## 2024-06-26 DIAGNOSIS — R40242 Glasgow coma scale score 9-12, unspecified time: Secondary | ICD-10-CM

## 2024-06-26 DIAGNOSIS — G2581 Restless legs syndrome: Secondary | ICD-10-CM | POA: Diagnosis present

## 2024-06-26 DIAGNOSIS — R402421 Glasgow coma scale score 9-12, in the field [EMT or ambulance]: Principal | ICD-10-CM

## 2024-06-26 DIAGNOSIS — R4701 Aphasia: Secondary | ICD-10-CM | POA: Diagnosis present

## 2024-06-26 DIAGNOSIS — R159 Full incontinence of feces: Secondary | ICD-10-CM | POA: Diagnosis present

## 2024-06-26 DIAGNOSIS — D696 Thrombocytopenia, unspecified: Secondary | ICD-10-CM | POA: Diagnosis present

## 2024-06-26 DIAGNOSIS — R17 Unspecified jaundice: Secondary | ICD-10-CM | POA: Diagnosis present

## 2024-06-26 DIAGNOSIS — T43215A Adverse effect of selective serotonin and norepinephrine reuptake inhibitors, initial encounter: Secondary | ICD-10-CM | POA: Diagnosis present

## 2024-06-26 DIAGNOSIS — X749XXS Intentional self-harm by unspecified firearm discharge, sequela: Secondary | ICD-10-CM

## 2024-06-26 DIAGNOSIS — S0193XS Puncture wound without foreign body of unspecified part of head, sequela: Secondary | ICD-10-CM

## 2024-06-26 DIAGNOSIS — T43595A Adverse effect of other antipsychotics and neuroleptics, initial encounter: Secondary | ICD-10-CM | POA: Diagnosis present

## 2024-06-26 DIAGNOSIS — T441X5A Adverse effect of other parasympathomimetics [cholinergics], initial encounter: Secondary | ICD-10-CM | POA: Diagnosis present

## 2024-06-26 DIAGNOSIS — Z8673 Personal history of transient ischemic attack (TIA), and cerebral infarction without residual deficits: Secondary | ICD-10-CM

## 2024-06-26 DIAGNOSIS — R680 Hypothermia, not associated with low environmental temperature: Secondary | ICD-10-CM | POA: Diagnosis present

## 2024-06-26 DIAGNOSIS — R1310 Dysphagia, unspecified: Secondary | ICD-10-CM | POA: Diagnosis present

## 2024-06-26 DIAGNOSIS — G928 Other toxic encephalopathy: Secondary | ICD-10-CM | POA: Diagnosis not present

## 2024-06-26 DIAGNOSIS — R578 Other shock: Principal | ICD-10-CM | POA: Diagnosis present

## 2024-06-26 DIAGNOSIS — Z886 Allergy status to analgesic agent status: Secondary | ICD-10-CM

## 2024-06-26 DIAGNOSIS — Z66 Do not resuscitate: Secondary | ICD-10-CM | POA: Diagnosis present

## 2024-06-26 DIAGNOSIS — J449 Chronic obstructive pulmonary disease, unspecified: Secondary | ICD-10-CM | POA: Diagnosis present

## 2024-06-26 DIAGNOSIS — G9340 Encephalopathy, unspecified: Secondary | ICD-10-CM | POA: Diagnosis present

## 2024-06-26 DIAGNOSIS — F809 Developmental disorder of speech and language, unspecified: Secondary | ICD-10-CM

## 2024-06-26 DIAGNOSIS — F0390 Unspecified dementia without behavioral disturbance: Secondary | ICD-10-CM | POA: Diagnosis present

## 2024-06-26 DIAGNOSIS — G9341 Metabolic encephalopathy: Secondary | ICD-10-CM | POA: Diagnosis present

## 2024-06-26 DIAGNOSIS — M069 Rheumatoid arthritis, unspecified: Secondary | ICD-10-CM | POA: Diagnosis present

## 2024-06-26 DIAGNOSIS — R3981 Functional urinary incontinence: Secondary | ICD-10-CM | POA: Insufficient documentation

## 2024-06-26 DIAGNOSIS — R001 Bradycardia, unspecified: Secondary | ICD-10-CM | POA: Diagnosis present

## 2024-06-26 DIAGNOSIS — S069XAS Unspecified intracranial injury with loss of consciousness status unknown, sequela: Secondary | ICD-10-CM

## 2024-06-26 DIAGNOSIS — D509 Iron deficiency anemia, unspecified: Secondary | ICD-10-CM | POA: Diagnosis present

## 2024-06-26 DIAGNOSIS — Z79899 Other long term (current) drug therapy: Secondary | ICD-10-CM

## 2024-06-26 DIAGNOSIS — N39498 Other specified urinary incontinence: Secondary | ICD-10-CM | POA: Diagnosis present

## 2024-06-26 DIAGNOSIS — E872 Acidosis, unspecified: Secondary | ICD-10-CM | POA: Diagnosis present

## 2024-06-26 DIAGNOSIS — A419 Sepsis, unspecified organism: Secondary | ICD-10-CM

## 2024-06-26 DIAGNOSIS — T68XXXA Hypothermia, initial encounter: Secondary | ICD-10-CM | POA: Diagnosis not present

## 2024-06-26 DIAGNOSIS — H9190 Unspecified hearing loss, unspecified ear: Secondary | ICD-10-CM | POA: Diagnosis present

## 2024-06-26 DIAGNOSIS — F0670 Mild neurocognitive disorder due to known physiological condition without behavioral disturbance: Secondary | ICD-10-CM | POA: Diagnosis present

## 2024-06-26 DIAGNOSIS — E43 Unspecified severe protein-calorie malnutrition: Secondary | ICD-10-CM | POA: Diagnosis present

## 2024-06-26 LAB — URINALYSIS, W/ REFLEX TO CULTURE (INFECTION SUSPECTED)
Bilirubin Urine: NEGATIVE
Glucose, UA: NEGATIVE mg/dL
Hgb urine dipstick: NEGATIVE
Ketones, ur: NEGATIVE mg/dL
Leukocytes,Ua: NEGATIVE
Nitrite: NEGATIVE
Protein, ur: NEGATIVE mg/dL
Specific Gravity, Urine: 1.008 (ref 1.005–1.030)
pH: 7 (ref 5.0–8.0)

## 2024-06-26 LAB — I-STAT CHEM 8, ED
BUN: 16 mg/dL (ref 8–23)
Calcium, Ion: 1.19 mmol/L (ref 1.15–1.40)
Chloride: 103 mmol/L (ref 98–111)
Creatinine, Ser: 0.9 mg/dL (ref 0.61–1.24)
Glucose, Bld: 82 mg/dL (ref 70–99)
HCT: 34 % — ABNORMAL LOW (ref 39.0–52.0)
Hemoglobin: 11.6 g/dL — ABNORMAL LOW (ref 13.0–17.0)
Potassium: 3.9 mmol/L (ref 3.5–5.1)
Sodium: 143 mmol/L (ref 135–145)
TCO2: 29 mmol/L (ref 22–32)

## 2024-06-26 LAB — COMPREHENSIVE METABOLIC PANEL WITH GFR
ALT: 20 U/L (ref 0–44)
AST: 21 U/L (ref 15–41)
Albumin: 3.1 g/dL — ABNORMAL LOW (ref 3.5–5.0)
Alkaline Phosphatase: 54 U/L (ref 38–126)
Anion gap: 7 (ref 5–15)
BUN: 15 mg/dL (ref 8–23)
CO2: 29 mmol/L (ref 22–32)
Calcium: 8.5 mg/dL — ABNORMAL LOW (ref 8.9–10.3)
Chloride: 107 mmol/L (ref 98–111)
Creatinine, Ser: 0.83 mg/dL (ref 0.61–1.24)
GFR, Estimated: >60 mL/min (ref >60)
Glucose, Bld: 84 mg/dL (ref 70–99)
Potassium: 3.9 mmol/L (ref 3.5–5.1)
Sodium: 143 mmol/L (ref 135–145)
Total Bilirubin: 0.7 mg/dL (ref 0.0–1.2)
Total Protein: 5.4 g/dL — ABNORMAL LOW (ref 6.5–8.1)

## 2024-06-26 LAB — MAGNESIUM: Magnesium: 1.8 mg/dL (ref 1.7–2.4)

## 2024-06-26 LAB — I-STAT VENOUS BLOOD GAS, ED
Acid-Base Excess: 4 mmol/L — ABNORMAL HIGH (ref 0.0–2.0)
Acid-Base Excess: 5 mmol/L — ABNORMAL HIGH (ref 0.0–2.0)
Bicarbonate: 31.2 mmol/L — ABNORMAL HIGH (ref 20.0–28.0)
Bicarbonate: 29 mmol/L — ABNORMAL HIGH (ref 20.0–28.0)
Calcium, Ion: 1.19 mmol/L (ref 1.15–1.40)
Calcium, Ion: 1.14 mmol/L — ABNORMAL LOW (ref 1.15–1.40)
HCT: 33 % — ABNORMAL LOW (ref 39.0–52.0)
HCT: 34 % — ABNORMAL LOW (ref 39.0–52.0)
Hemoglobin: 11.2 g/dL — ABNORMAL LOW (ref 13.0–17.0)
Hemoglobin: 11.6 g/dL — ABNORMAL LOW (ref 13.0–17.0)
O2 Saturation: 96 %
O2 Saturation: 99 %
Potassium: 3.9 mmol/L (ref 3.5–5.1)
Potassium: 4.3 mmol/L (ref 3.5–5.1)
Sodium: 143 mmol/L (ref 135–145)
Sodium: 144 mmol/L (ref 135–145)
TCO2: 33 mmol/L — ABNORMAL HIGH (ref 22–32)
TCO2: 30 mmol/L (ref 22–32)
pCO2, Ven: 60.8 mmHg — ABNORMAL HIGH (ref 44–60)
pCO2, Ven: 40.5 mmHg — ABNORMAL LOW (ref 44–60)
pH, Ven: 7.318 (ref 7.25–7.43)
pH, Ven: 7.463 — ABNORMAL HIGH (ref 7.25–7.43)
pO2, Ven: 89 mmHg — ABNORMAL HIGH (ref 32–45)
pO2, Ven: 146 mmHg — ABNORMAL HIGH (ref 32–45)

## 2024-06-26 LAB — RESP PANEL BY RT-PCR (RSV, FLU A&B, COVID)  RVPGX2
Influenza A by PCR: NEGATIVE
Influenza B by PCR: NEGATIVE
Resp Syncytial Virus by PCR: NEGATIVE
SARS Coronavirus 2 by RT PCR: NEGATIVE

## 2024-06-26 LAB — CBC WITH DIFFERENTIAL/PLATELET
Abs Immature Granulocytes: 0.01 10*3/uL (ref 0.00–0.07)
Basophils Absolute: 0 10*3/uL (ref 0.0–0.1)
Basophils Relative: 0 %
Eosinophils Absolute: 0.1 10*3/uL (ref 0.0–0.5)
Eosinophils Relative: 2 %
HCT: 35.7 % — ABNORMAL LOW (ref 39.0–52.0)
Hemoglobin: 11.4 g/dL — ABNORMAL LOW (ref 13.0–17.0)
Immature Granulocytes: 0 %
Lymphocytes Relative: 37 %
Lymphs Abs: 1.7 10*3/uL (ref 0.7–4.0)
MCH: 30.6 pg (ref 26.0–34.0)
MCHC: 31.9 g/dL (ref 30.0–36.0)
MCV: 96 fL (ref 80.0–100.0)
Monocytes Absolute: 0.3 10*3/uL (ref 0.1–1.0)
Monocytes Relative: 7 %
Neutro Abs: 2.4 10*3/uL (ref 1.7–7.7)
Neutrophils Relative %: 54 %
Platelets: 113 10*3/uL — ABNORMAL LOW (ref 150–400)
RBC: 3.72 MIL/uL — ABNORMAL LOW (ref 4.22–5.81)
RDW: 14 % (ref 11.5–15.5)
WBC: 4.5 10*3/uL (ref 4.0–10.5)
nRBC: 0 % (ref 0.0–0.2)

## 2024-06-26 LAB — PROTIME-INR
INR: 1.1 (ref 0.8–1.2)
Prothrombin Time: 14.6 s (ref 11.4–15.2)

## 2024-06-26 LAB — TROPONIN I (HIGH SENSITIVITY)
Troponin I (High Sensitivity): 3 ng/L (ref <18)
Troponin I (High Sensitivity): 4 ng/L (ref <18)

## 2024-06-26 LAB — HIV ANTIBODY (ROUTINE TESTING W REFLEX): HIV Screen 4th Generation wRfx: NONREACTIVE

## 2024-06-26 LAB — TSH: TSH: 0.972 u[IU]/mL (ref 0.350–4.500)

## 2024-06-26 LAB — I-STAT CG4 LACTIC ACID, ED: Lactic Acid, Venous: 0.5 mmol/L (ref 0.5–1.9)

## 2024-06-26 MED ORDER — SODIUM CHLORIDE 0.9 % IV SOLN
2.0000 g | Freq: Once | INTRAVENOUS | Status: AC
  Administered 2024-06-26: 2 g via INTRAVENOUS
  Filled 2024-06-26: qty 12.5

## 2024-06-26 MED ORDER — HYDROCORTISONE SOD SUC (PF) 100 MG IJ SOLR
100.0000 mg | Freq: Once | INTRAMUSCULAR | Status: AC
  Administered 2024-06-26: 100 mg via INTRAVENOUS
  Filled 2024-06-26: qty 2

## 2024-06-26 MED ORDER — ENOXAPARIN SODIUM 40 MG/0.4ML IJ SOSY
40.0000 mg | PREFILLED_SYRINGE | INTRAMUSCULAR | Status: DC
  Filled 2024-06-26: qty 0.4

## 2024-06-26 MED ORDER — ENOXAPARIN SODIUM 40 MG/0.4ML IJ SOSY
40.0000 mg | PREFILLED_SYRINGE | Freq: Every day | INTRAMUSCULAR | Status: DC
  Administered 2024-06-26 – 2024-06-29 (×4): 40 mg via SUBCUTANEOUS
  Filled 2024-06-26 (×3): qty 0.4

## 2024-06-26 MED ORDER — LACTATED RINGERS IV BOLUS (SEPSIS)
1000.0000 mL | Freq: Once | INTRAVENOUS | Status: AC
  Administered 2024-06-26: 1000 mL via INTRAVENOUS

## 2024-06-26 MED ORDER — VANCOMYCIN HCL IN DEXTROSE 1-5 GM/200ML-% IV SOLN: 1000.0000 mg | Freq: Once | INTRAVENOUS | Status: DC

## 2024-06-26 MED ORDER — LACTATED RINGERS IV BOLUS (SEPSIS)
500.0000 mL | Freq: Once | INTRAVENOUS | Status: AC
  Administered 2024-06-26: 500 mL via INTRAVENOUS

## 2024-06-26 MED ORDER — LACTATED RINGERS IV SOLN
INTRAVENOUS | Status: DC
Start: 2024-06-26 — End: 2024-06-27

## 2024-06-26 MED ORDER — TRAZODONE HCL 50 MG PO TABS
100.0000 mg | ORAL_TABLET | Freq: Every day | ORAL | Status: DC
  Administered 2024-06-26: 100 mg via ORAL
  Filled 2024-06-26: qty 1

## 2024-06-26 MED ORDER — PAROXETINE HCL 30 MG PO TABS
30.0000 mg | ORAL_TABLET | Freq: Every day | ORAL | Status: DC
  Administered 2024-06-27 – 2024-06-30 (×4): 30 mg via ORAL
  Filled 2024-06-26 (×5): qty 1

## 2024-06-26 MED ORDER — METRONIDAZOLE 500 MG/100ML IV SOLN
500.0000 mg | Freq: Once | INTRAVENOUS | Status: AC
  Administered 2024-06-26: 500 mg via INTRAVENOUS
  Filled 2024-06-26: qty 100

## 2024-06-26 MED ORDER — CLONAZEPAM 0.5 MG PO TABS
0.5000 mg | ORAL_TABLET | Freq: Two times a day (BID) | ORAL | Status: DC
  Administered 2024-06-27 – 2024-06-30 (×7): 0.5 mg via ORAL
  Filled 2024-06-26 (×8): qty 1

## 2024-06-26 MED ORDER — VANCOMYCIN HCL 1500 MG/300ML IV SOLN
1500.0000 mg | Freq: Once | INTRAVENOUS | Status: AC
  Administered 2024-06-26: 1500 mg via INTRAVENOUS
  Filled 2024-06-26: qty 300

## 2024-06-26 MED ORDER — QUETIAPINE FUMARATE 50 MG PO TABS
50.0000 mg | ORAL_TABLET | Freq: Three times a day (TID) | ORAL | Status: DC
  Administered 2024-06-26: 50 mg via ORAL
  Filled 2024-06-26 (×2): qty 1

## 2024-06-26 NOTE — Hospital Course (Addendum)
 

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
## 2024-06-26 NOTE — ED Notes (Signed)
 [name] from [employer] called to see if she could get an update on this patient [phone]
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
## 2024-06-26 NOTE — ED Triage Notes (Addendum)
 Pt bib ems from [facility]. Pt has been altered since Sunday due to staff. Staff reports non verbal at baseline but normally interactive with staff. Pt has not been interactive since Sunday. This morning he is lethargic and difficult to arise with hr in the 40's. 1mg  Atropine , 1mg  narcan given.

## 2024-06-26 NOTE — Sepsis Progress Note (Signed)
 Code Sepsis protocol being monitored by eLink.

## 2024-06-27 ENCOUNTER — Observation Stay (HOSPITAL_COMMUNITY): Payer: MEDICAID

## 2024-06-27 ENCOUNTER — Inpatient Hospital Stay (HOSPITAL_COMMUNITY): Payer: MEDICAID

## 2024-06-27 DIAGNOSIS — G9341 Metabolic encephalopathy: Secondary | ICD-10-CM | POA: Diagnosis present

## 2024-06-27 DIAGNOSIS — T68XXXA Hypothermia, initial encounter: Secondary | ICD-10-CM | POA: Diagnosis not present

## 2024-06-27 DIAGNOSIS — R159 Full incontinence of feces: Secondary | ICD-10-CM | POA: Diagnosis present

## 2024-06-27 DIAGNOSIS — G9340 Encephalopathy, unspecified: Secondary | ICD-10-CM | POA: Diagnosis present

## 2024-06-27 DIAGNOSIS — R008 Other abnormalities of heart beat: Secondary | ICD-10-CM | POA: Diagnosis not present

## 2024-06-27 DIAGNOSIS — Z886 Allergy status to analgesic agent status: Secondary | ICD-10-CM | POA: Diagnosis not present

## 2024-06-27 DIAGNOSIS — R001 Bradycardia, unspecified: Secondary | ICD-10-CM | POA: Diagnosis present

## 2024-06-27 DIAGNOSIS — E872 Acidosis, unspecified: Secondary | ICD-10-CM | POA: Diagnosis present

## 2024-06-27 DIAGNOSIS — R40242 Glasgow coma scale score 9-12, unspecified time: Secondary | ICD-10-CM

## 2024-06-27 DIAGNOSIS — N39498 Other specified urinary incontinence: Secondary | ICD-10-CM | POA: Diagnosis present

## 2024-06-27 DIAGNOSIS — E46 Unspecified protein-calorie malnutrition: Secondary | ICD-10-CM

## 2024-06-27 DIAGNOSIS — F0390 Unspecified dementia without behavioral disturbance: Secondary | ICD-10-CM | POA: Diagnosis present

## 2024-06-27 DIAGNOSIS — X749XXS Intentional self-harm by unspecified firearm discharge, sequela: Secondary | ICD-10-CM | POA: Diagnosis not present

## 2024-06-27 DIAGNOSIS — D509 Iron deficiency anemia, unspecified: Secondary | ICD-10-CM | POA: Diagnosis present

## 2024-06-27 DIAGNOSIS — Z66 Do not resuscitate: Secondary | ICD-10-CM | POA: Diagnosis present

## 2024-06-27 DIAGNOSIS — D696 Thrombocytopenia, unspecified: Secondary | ICD-10-CM | POA: Diagnosis present

## 2024-06-27 DIAGNOSIS — R17 Unspecified jaundice: Secondary | ICD-10-CM | POA: Diagnosis present

## 2024-06-27 DIAGNOSIS — R680 Hypothermia, not associated with low environmental temperature: Secondary | ICD-10-CM | POA: Diagnosis present

## 2024-06-27 DIAGNOSIS — R569 Unspecified convulsions: Secondary | ICD-10-CM

## 2024-06-27 DIAGNOSIS — E43 Unspecified severe protein-calorie malnutrition: Secondary | ICD-10-CM | POA: Diagnosis present

## 2024-06-27 DIAGNOSIS — J449 Chronic obstructive pulmonary disease, unspecified: Secondary | ICD-10-CM | POA: Diagnosis present

## 2024-06-27 DIAGNOSIS — S069XAS Unspecified intracranial injury with loss of consciousness status unknown, sequela: Secondary | ICD-10-CM | POA: Diagnosis not present

## 2024-06-27 DIAGNOSIS — R578 Other shock: Secondary | ICD-10-CM | POA: Diagnosis present

## 2024-06-27 DIAGNOSIS — H9190 Unspecified hearing loss, unspecified ear: Secondary | ICD-10-CM | POA: Diagnosis present

## 2024-06-27 DIAGNOSIS — M069 Rheumatoid arthritis, unspecified: Secondary | ICD-10-CM | POA: Diagnosis present

## 2024-06-27 DIAGNOSIS — S0193XS Puncture wound without foreign body of unspecified part of head, sequela: Secondary | ICD-10-CM | POA: Diagnosis not present

## 2024-06-27 DIAGNOSIS — R1310 Dysphagia, unspecified: Secondary | ICD-10-CM | POA: Diagnosis present

## 2024-06-27 DIAGNOSIS — R402421 Glasgow coma scale score 9-12, in the field [EMT or ambulance]: Secondary | ICD-10-CM | POA: Diagnosis not present

## 2024-06-27 DIAGNOSIS — G2581 Restless legs syndrome: Secondary | ICD-10-CM | POA: Diagnosis present

## 2024-06-27 DIAGNOSIS — Z8673 Personal history of transient ischemic attack (TIA), and cerebral infarction without residual deficits: Secondary | ICD-10-CM | POA: Diagnosis not present

## 2024-06-27 DIAGNOSIS — R4701 Aphasia: Secondary | ICD-10-CM | POA: Diagnosis present

## 2024-06-27 DIAGNOSIS — I959 Hypotension, unspecified: Secondary | ICD-10-CM | POA: Diagnosis not present

## 2024-06-27 DIAGNOSIS — F0670 Mild neurocognitive disorder due to known physiological condition without behavioral disturbance: Secondary | ICD-10-CM | POA: Diagnosis present

## 2024-06-27 DIAGNOSIS — E8729 Other acidosis: Secondary | ICD-10-CM

## 2024-06-27 LAB — ECHOCARDIOGRAM COMPLETE
Area-P 1/2: 3.03 cm2
Height: 69 in
S' Lateral: 3 cm
Weight: 2560 [oz_av]

## 2024-06-27 LAB — CBC WITH DIFFERENTIAL/PLATELET
Abs Immature Granulocytes: 0.02 10*3/uL (ref 0.00–0.07)
Basophils Absolute: 0 10*3/uL (ref 0.0–0.1)
Basophils Relative: 0 %
Eosinophils Absolute: 0 10*3/uL (ref 0.0–0.5)
Eosinophils Relative: 1 %
HCT: 31.8 % — ABNORMAL LOW (ref 39.0–52.0)
Hemoglobin: 10.4 g/dL — ABNORMAL LOW (ref 13.0–17.0)
Immature Granulocytes: 0 %
Lymphocytes Relative: 21 %
Lymphs Abs: 1.2 10*3/uL (ref 0.7–4.0)
MCH: 30.7 pg (ref 26.0–34.0)
MCHC: 32.7 g/dL (ref 30.0–36.0)
MCV: 93.8 fL (ref 80.0–100.0)
Monocytes Absolute: 0.4 10*3/uL (ref 0.1–1.0)
Monocytes Relative: 7 %
Neutro Abs: 4 10*3/uL (ref 1.7–7.7)
Neutrophils Relative %: 71 %
Platelets: 96 10*3/uL — ABNORMAL LOW (ref 150–400)
RBC: 3.39 MIL/uL — ABNORMAL LOW (ref 4.22–5.81)
RDW: 14 % (ref 11.5–15.5)
WBC: 5.7 10*3/uL (ref 4.0–10.5)
nRBC: 0 % (ref 0.0–0.2)

## 2024-06-27 LAB — COMPREHENSIVE METABOLIC PANEL WITH GFR
ALT: 9 U/L (ref 0–44)
AST: 9 U/L — ABNORMAL LOW (ref 15–41)
Albumin: <1.5 g/dL — ABNORMAL LOW (ref 3.5–5.0)
Alkaline Phosphatase: 20 U/L — ABNORMAL LOW (ref 38–126)
Anion gap: 13 (ref 5–15)
BUN: 9 mg/dL (ref 8–23)
CO2: 15 mmol/L — ABNORMAL LOW (ref 22–32)
Calcium: 6.8 mg/dL — ABNORMAL LOW (ref 8.9–10.3)
Chloride: 106 mmol/L (ref 98–111)
Creatinine, Ser: 0.33 mg/dL — ABNORMAL LOW (ref 0.61–1.24)
GFR, Estimated: >60 mL/min (ref >60)
Glucose, Bld: 51 mg/dL — ABNORMAL LOW (ref 70–99)
Potassium: 3.8 mmol/L (ref 3.5–5.1)
Sodium: 134 mmol/L — ABNORMAL LOW (ref 135–145)
Total Bilirubin: 0.2 mg/dL (ref 0.0–1.2)
Total Protein: <3 g/dL — ABNORMAL LOW (ref 6.5–8.1)

## 2024-06-27 LAB — GLUCOSE, CAPILLARY
Glucose-Capillary: 116 mg/dL — ABNORMAL HIGH (ref 70–99)
Glucose-Capillary: 95 mg/dL (ref 70–99)

## 2024-06-27 LAB — VITAMIN B12: Vitamin B-12: 240 pg/mL (ref 180–914)

## 2024-06-27 LAB — IRON AND TIBC
Iron: 36 ug/dL — ABNORMAL LOW (ref 45–182)
Saturation Ratios: 21 % (ref 17.9–39.5)
TIBC: 168 ug/dL — ABNORMAL LOW (ref 250–450)
UIBC: 132 ug/dL

## 2024-06-27 LAB — LACTIC ACID, PLASMA
Lactic Acid, Venous: 8.5 mmol/L (ref 0.5–1.9)
Lactic Acid, Venous: 1.2 mmol/L (ref 0.5–1.9)
Lactic Acid, Venous: 4 mmol/L (ref 0.5–1.9)
Lactic Acid, Venous: 1.8 mmol/L (ref 0.5–1.9)

## 2024-06-27 LAB — TECHNOLOGIST SMEAR REVIEW: Plt Morphology: NORMAL

## 2024-06-27 LAB — T4, FREE: Free T4: 0.84 ng/dL (ref 0.61–1.12)

## 2024-06-27 LAB — FERRITIN: Ferritin: 100 ng/mL (ref 24–336)

## 2024-06-27 LAB — TROPONIN I (HIGH SENSITIVITY)
Troponin I (High Sensitivity): <2 ng/L (ref <18)
Troponin I (High Sensitivity): 4 ng/L (ref <18)

## 2024-06-27 LAB — CORTISOL: Cortisol, Plasma: 51.4 ug/dL

## 2024-06-27 LAB — FOLATE: Folate: 3.3 ng/mL — ABNORMAL LOW (ref >5.9)

## 2024-06-27 LAB — CK: Total CK: 51 U/L (ref 49–397)

## 2024-06-27 LAB — PROCALCITONIN: Procalcitonin: <0.1 ng/mL

## 2024-06-27 LAB — VITAMIN D 25 HYDROXY (VIT D DEFICIENCY, FRACTURES): Vit D, 25-Hydroxy: 21.12 ng/mL — ABNORMAL LOW (ref 30–100)

## 2024-06-27 LAB — MRSA NEXT GEN BY PCR, NASAL: MRSA by PCR Next Gen: NOT DETECTED

## 2024-06-27 MED ORDER — SODIUM CHLORIDE 0.9 % IV SOLN
3.0000 g | Freq: Four times a day (QID) | INTRAVENOUS | Status: DC
  Administered 2024-06-27 – 2024-06-28 (×5): 3 g via INTRAVENOUS
  Filled 2024-06-27 (×5): qty 8

## 2024-06-27 MED ORDER — SODIUM CHLORIDE 0.9 % IV SOLN: 250.0000 mL | INTRAVENOUS | Status: AC

## 2024-06-27 MED ORDER — THIAMINE HCL 100 MG/ML IJ SOLN
100.0000 mg | Freq: Every day | INTRAMUSCULAR | Status: DC
  Administered 2024-06-27: 100 mg via INTRAVENOUS
  Filled 2024-06-27: qty 2

## 2024-06-27 MED ORDER — FERROUS SULFATE 325 (65 FE) MG PO TABS
325.0000 mg | ORAL_TABLET | Freq: Every day | ORAL | Status: DC
  Administered 2024-06-28 – 2024-06-30 (×3): 325 mg via ORAL
  Filled 2024-06-27 (×3): qty 1

## 2024-06-27 MED ORDER — LACTATED RINGERS IV BOLUS
1000.0000 mL | Freq: Once | INTRAVENOUS | Status: AC
  Administered 2024-06-27: 1000 mL via INTRAVENOUS

## 2024-06-27 MED ORDER — CYANOCOBALAMIN 1000 MCG/ML IJ SOLN
1000.0000 ug | Freq: Once | INTRAMUSCULAR | Status: AC
  Administered 2024-06-27: 1000 ug via INTRAMUSCULAR
  Filled 2024-06-27: qty 1

## 2024-06-27 MED ORDER — THIAMINE MONONITRATE 100 MG PO TABS
100.0000 mg | ORAL_TABLET | Freq: Every day | ORAL | Status: DC
  Administered 2024-06-28 – 2024-06-30 (×3): 100 mg via ORAL
  Filled 2024-06-27 (×3): qty 1

## 2024-06-27 MED ORDER — HYDROCORTISONE SOD SUC (PF) 100 MG IJ SOLR
100.0000 mg | Freq: Two times a day (BID) | INTRAMUSCULAR | Status: DC
  Filled 2024-06-27: qty 2

## 2024-06-27 MED ORDER — NOREPINEPHRINE 4 MG/250ML-% IV SOLN
0.0000 ug/min | INTRAVENOUS | Status: DC
  Administered 2024-06-27 (×2): 2 ug/min via INTRAVENOUS
  Filled 2024-06-27 (×2): qty 250

## 2024-06-27 MED ORDER — CHLORHEXIDINE GLUCONATE CLOTH 2 % EX PADS
6.0000 | MEDICATED_PAD | Freq: Every day | CUTANEOUS | Status: DC
  Administered 2024-06-27 – 2024-06-28 (×2): 6 via TOPICAL

## 2024-06-27 MED ORDER — LACTATED RINGERS IV BOLUS
500.0000 mL | Freq: Once | INTRAVENOUS | Status: AC
  Administered 2024-06-27: 500 mL via INTRAVENOUS

## 2024-06-27 MED ORDER — HYDROCORTISONE SOD SUC (PF) 100 MG IJ SOLR
100.0000 mg | Freq: Once | INTRAMUSCULAR | Status: AC
  Administered 2024-06-27: 100 mg via INTRAVENOUS
  Filled 2024-06-27: qty 2

## 2024-06-27 MED ORDER — DOPAMINE-DEXTROSE 3.2-5 MG/ML-% IV SOLN
0.0000 ug/kg/min | INTRAVENOUS | Status: DC
  Administered 2024-06-27: 5 ug/kg/min via INTRAVENOUS

## 2024-06-27 MED ORDER — ORAL CARE MOUTH RINSE: 15.0000 mL | OROMUCOSAL | Status: DC | PRN

## 2024-06-27 MED ORDER — HYDROCORTISONE SOD SUC (PF) 100 MG IJ SOLR
50.0000 mg | Freq: Four times a day (QID) | INTRAMUSCULAR | Status: DC
  Filled 2024-06-27 (×2): qty 1

## 2024-06-27 MED ORDER — VANCOMYCIN HCL IN DEXTROSE 1-5 GM/200ML-% IV SOLN
1000.0000 mg | Freq: Two times a day (BID) | INTRAVENOUS | Status: DC
  Administered 2024-06-27: 1000 mg via INTRAVENOUS
  Filled 2024-06-27: qty 200

## 2024-06-27 MED ORDER — ATROPINE SULFATE 1 MG/10ML IJ SOSY
0.5000 mg | PREFILLED_SYRINGE | INTRAMUSCULAR | Status: DC | PRN
  Administered 2024-06-27: 0.5 mg via INTRAVENOUS
  Filled 2024-06-27 (×2): qty 10

## 2024-06-27 MED ORDER — FOLIC ACID 5 MG/ML IJ SOLN
1.0000 mg | Freq: Every day | INTRAMUSCULAR | Status: DC
  Filled 2024-06-27 (×2): qty 0.2

## 2024-06-27 MED ORDER — PIPERACILLIN-TAZOBACTAM 3.375 G IVPB
3.3750 g | Freq: Three times a day (TID) | INTRAVENOUS | Status: DC
  Administered 2024-06-27: 3.375 g via INTRAVENOUS
  Filled 2024-06-27: qty 50

## 2024-06-27 MED ORDER — ATROPINE SULFATE 1 MG/ML IV SOLN: 0.5000 mg | INTRAVENOUS | Status: DC | PRN

## 2024-06-27 MED ORDER — FOLIC ACID 1 MG PO TABS
1.0000 mg | ORAL_TABLET | Freq: Every day | ORAL | Status: DC
  Administered 2024-06-27 – 2024-06-30 (×4): 1 mg via ORAL
  Filled 2024-06-27 (×4): qty 1

## 2024-06-27 MED ORDER — HYDROCORTISONE SOD SUC (PF) 100 MG IJ SOLR
100.0000 mg | Freq: Once | INTRAMUSCULAR | Status: DC
  Filled 2024-06-27: qty 2

## 2024-06-27 MED ORDER — ALBUMIN HUMAN 25 % IV SOLN
25.0000 g | Freq: Four times a day (QID) | INTRAVENOUS | Status: AC
  Administered 2024-06-27 – 2024-06-28 (×4): 25 g via INTRAVENOUS
  Filled 2024-06-27 (×4): qty 100

## 2024-06-27 MED ORDER — DOPAMINE-DEXTROSE 3.2-5 MG/ML-% IV SOLN
0.0000 ug/kg/min | INTRAVENOUS | Status: DC
  Administered 2024-06-27: 5 ug/kg/min via INTRAVENOUS
  Filled 2024-06-27 (×2): qty 250

## 2024-06-27 MED ORDER — LACTATED RINGERS IV SOLN: INTRAVENOUS | Status: AC

## 2024-06-27 NOTE — Progress Notes (Signed)
  Echocardiogram 2D Echocardiogram has been performed.  Koleen KANDICE Popper, RDCS 06/27/2024, 8:36 AM

## 2024-06-27 NOTE — Progress Notes (Addendum)
 Alerted by nursing of heart rate in the low 40s and blood pressure 91/48.  Also was having no urine output since In-N-Out cath several hours ago and found to have over 400 mL on bladder scan.  Requested complete vitals which showed return of hypothermia with rectal temperature 95 F.  We went to see the patient and vitals were remaining stable with bradycardia, hypothermia, hypotension, but no hypoxia and satting well on room air.  On exam he was responsive to verbal stimuli and based on patient handoff appeared near baseline mentation.  No other significant abnormalities on cardiopulmonary exam other than bradycardia with a regular rhythm.  Extremities were warm and well-perfused.  Limited neuroexam showed no facial asymmetry, pupil or eye movement abnormalities, and patient follows simple commands although quickly fell back asleep.  Concerned for continued adrenal insufficiency causing hypothermia, bradycardia, and hypotension so after getting 100 mg hydrocortisone IV at 11:30 AM on 06/26/2024 we gave another dose of 100 mg at 2:30 AM on 06/27/2024 and given 1 L LR bolus after he had been on 150 mL an hour of LR following 2 L IV bolus since admission with a total of 2.5 liters IV fluid documented so far.  Placed pacing pads on the patient.  Also requested a bair hugger in addition to the warm blankets he already had on.  Ordered stat labs including CBC, CMP, troponin, lactic acid, and got an EKG which showed sinus bradycardia.  Over 30 minutes after giving hydrocortisone his vitals remained the same so the case was discussed with Dr. Maree with PCCM.  Patient is a DNR/DNI.  I confirmed this with his brother over the phone who also is his legal guardian and states that the patient would be okay with transfer to the ICU but not being intubated.  Uncertain of definitive cause but adrenal insufficiency is still most likely possibly in the setting of sepsis although blood cultures drawn on 06/26/2024 have not resulted  yet.  TSH was 0.9 indicating intact pituitary function.  No severe electrolyte abnormalities.  Does have a history of a TBI however these vital sign abnormalities are not typical for him.  He is also on Seroquel  and trazodone  chronically.  Seroquel  does have a listed side effect of thyroid abnormalities and there are rare instances of hypothermia with antipsychotics although this is a chronic med for him.  Plan - Stat CBC, CMP, troponin, lactic acid - Check T4 - Continue 1 L LR bolus, if still borderline hypotensive we will do an additional liter - S/p 100 mg IV hydrocortisone, ordered 50 mg IV every 6 hours after this - Active rewarming to normal temperature - Atropine  0.5 mg IV as needed every 5 minutes for maximum of 3 mg if worsening bradycardia - Appreciate PCCM consult - Please page for any clinical or vital sign change including worsening bradycardia, evidence of heart block on telemetry, or MAP below 65  Fairy Pool, DO Internal Medicine Resident, PGY-3 Please contact the on call pager at [phone] for any urgent or emergent needs. 3:33 AM 06/27/2024   ADDENDUM PCCM, Dr. Maree at bedside. Pt remains bradycardic, hypothermic, and lactic acid returned at 8.5. After 1 L LR bolus his BP is stable and normal. Due to persistent bradycardia and low flow state due to bradycardia he will be transferred to the ICU for chronotropic support. We will be available to accept the patient when he is ready for transfer out of the ICU. Updated the brother, Alyne, over the phone who  states the patient's sister is also en route to the hospital.   Fairy Pool, DO Internal Medicine Resident, PGY-3 Please contact the on call pager at [phone] for any urgent or emergent needs. 4:33 AM 06/27/2024

## 2024-06-27 NOTE — Progress Notes (Signed)
 EEG complete - results pending
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
## 2024-06-27 NOTE — Progress Notes (Signed)
 PT Cancellation Note  Patient Details Name: [name] MRN: [id number] DOB: [birth year]   Cancelled Treatment:    Reason Eval/Treat Not Completed: (P) Medical issues which prohibited therapy RN request therapy check back tomorrow as pt BP is too low to tolerate therapy Evaluation today. PT will follow back tomorrow to see if patient is appropriate.  Caio Devera B. Fleeta Lapidus PT, DPT Acute Rehabilitation Services Please use secure chat or  Call Office [phone]    Almarie KATHEE Fleeta [facility] 06/27/2024, 9:42 AM

## 2024-06-27 NOTE — Progress Notes (Signed)
 Pharmacy Antibiotic Note  ARCHIT LEGER is a 63 y.o. male admitted on 06/26/2024 with hypotension/sepsis.  Pharmacy has been consulted for Vancomycin  and Zosyn  dosing.  Plan: Vancomycin  1000 mg IV q12h Zosyn 3.375 g IV q8h   Height: 5' 9 (175.3 cm) Weight: 72.6 kg (160 lb) IBW/kg (Calculated) : 70.7  Temp (24hrs), Avg:96.4 F (35.8 C), Min:92.1 F (33.4 C), Max:99 F (37.2 C)  Recent Labs  Lab 06/26/24 1110 06/26/24 1126 06/26/24 1127 06/27/24 0336 06/27/24 0339  WBC 4.5  --   --   --   --   CREATININE 0.83 0.90  --  0.33*  --   LATICACIDVEN  --   --  0.5  --  8.5*    Estimated Creatinine Clearance: 97 mL/min (A) (by C-G formula based on SCr of 0.33 mg/dL (L)).    Allergies  Allergen Reactions   Niacin And Related     Dail Cordella Misty 06/27/2024 4:37 AM
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
## 2024-06-27 NOTE — Plan of Care (Signed)
  Problem: Clinical Measurements: Goal: Diagnostic test results will improve Outcome: Not Progressing Goal: Respiratory complications will improve Outcome: Progressing   Problem: Clinical Measurements: Goal: Respiratory complications will improve Outcome: Progressing   Problem: Coping: Goal: Level of anxiety will decrease Outcome: Progressing

## 2024-06-27 NOTE — Progress Notes (Signed)
 Pt Hr in the low 40s , rectal tem 95.1, no urine out since removal of foley, bladder scan =413 in bladder,iv bolus, given, md order to insert foley cath. Remain alert to verbal stimuli, MD at bedside , Bp at this time 110/61.

## 2024-06-27 NOTE — Progress Notes (Signed)
 Transfer to 68M ICU Rm 08
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
## 2024-06-27 NOTE — Progress Notes (Signed)
 OT Cancellation Note  Patient Details Name: [name] MRN: [id number] DOB: 09-Jan-1961   Cancelled Treatment:    Reason Eval/Treat Not Completed: Medical issues which prohibited therapy (SBP in the 70s;RN also reporting potential aspiration this morning. Will follow up as pt medically appropriate and schedule allows.)  Vedder Brittian D Walton, OTD, OTR/L [facility] Acute Rehabilitation Office: [phone]   [name] 06/27/2024, 9:45 AM

## 2024-06-27 NOTE — Progress Notes (Signed)
 Interim CCM Progress Note:   Per family, not sure if patient is eating significant meals throughout the day, and per family patient has had a gradual weight loss over the last couple of years. Patient did have an aspiration event while eating a sausage biscuit- able to remove sausage pocketing in mouth.  Patient having ongoing hypotension once dopamine discontinued, lactate 8.0 >> 1.2>> 4.0, ECHO EF 60-65%, LV normal function, RV normal, No regional wall abnormalities  ECG showing sinus bradycardia  Albumin < 1.5 upon admission  P: Received albumin x 2, LR 1 liter bolus, started on levophed gtt- wean off as tolerated  Place on continuous IV fluids- LR at 75ml/hr  Repeat lactic acid, obtain procal  Speech therapy ordered- will need swallow eval  RD to follow for malnutrition MRSA negative, continue to follow [facility]- [facility]   Christian Rathana Viveros AGACNP-BC   [facility] 06/27/2024, 1:07 PM  Please see [email or web address] for pager details.  From 7A-7P if no response, please call [phone]. After hours, please call ELink [phone].
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
## 2024-06-28 ENCOUNTER — Inpatient Hospital Stay (HOSPITAL_COMMUNITY): Payer: MEDICAID

## 2024-06-28 DIAGNOSIS — R680 Hypothermia, not associated with low environmental temperature: Secondary | ICD-10-CM

## 2024-06-28 DIAGNOSIS — I959 Hypotension, unspecified: Secondary | ICD-10-CM

## 2024-06-28 DIAGNOSIS — G9341 Metabolic encephalopathy: Secondary | ICD-10-CM | POA: Diagnosis not present

## 2024-06-28 DIAGNOSIS — R001 Bradycardia, unspecified: Secondary | ICD-10-CM

## 2024-06-28 LAB — CBC
HCT: 36.7 % — ABNORMAL LOW (ref 39.0–52.0)
Hemoglobin: 11.9 g/dL — ABNORMAL LOW (ref 13.0–17.0)
MCH: 30.7 pg (ref 26.0–34.0)
MCHC: 32.4 g/dL (ref 30.0–36.0)
MCV: 94.6 fL (ref 80.0–100.0)
Platelets: 111 10*3/uL — ABNORMAL LOW (ref 150–400)
RBC: 3.88 MIL/uL — ABNORMAL LOW (ref 4.22–5.81)
RDW: 14.1 % (ref 11.5–15.5)
WBC: 9.6 10*3/uL (ref 4.0–10.5)
nRBC: 0 % (ref 0.0–0.2)

## 2024-06-28 LAB — COMPREHENSIVE METABOLIC PANEL WITH GFR
ALT: 36 U/L (ref 0–44)
AST: 33 U/L (ref 15–41)
Albumin: 3.7 g/dL (ref 3.5–5.0)
Alkaline Phosphatase: 50 U/L (ref 38–126)
Anion gap: 8 (ref 5–15)
BUN: 12 mg/dL (ref 8–23)
CO2: 30 mmol/L (ref 22–32)
Calcium: 8.7 mg/dL — ABNORMAL LOW (ref 8.9–10.3)
Chloride: 101 mmol/L (ref 98–111)
Creatinine, Ser: 0.91 mg/dL (ref 0.61–1.24)
GFR, Estimated: >60 mL/min (ref >60)
Glucose, Bld: 115 mg/dL — ABNORMAL HIGH (ref 70–99)
Potassium: 3.8 mmol/L (ref 3.5–5.1)
Sodium: 139 mmol/L (ref 135–145)
Total Bilirubin: 0.9 mg/dL (ref 0.0–1.2)
Total Protein: 5.7 g/dL — ABNORMAL LOW (ref 6.5–8.1)

## 2024-06-28 MED ORDER — POLYETHYLENE GLYCOL 3350 17 G PO PACK
17.0000 g | PACK | Freq: Every day | ORAL | Status: DC
  Administered 2024-06-28 – 2024-06-29 (×2): 17 g via ORAL
  Filled 2024-06-28 (×3): qty 1

## 2024-06-28 MED ORDER — ENSURE PLUS HIGH PROTEIN PO LIQD
237.0000 mL | Freq: Two times a day (BID) | ORAL | Status: DC
  Administered 2024-06-29 – 2024-06-30 (×4): 237 mL via ORAL
  Filled 2024-06-28: qty 237

## 2024-06-28 MED ORDER — DOCUSATE SODIUM 100 MG PO CAPS
100.0000 mg | ORAL_CAPSULE | Freq: Every day | ORAL | Status: DC
  Administered 2024-06-28 – 2024-06-30 (×3): 100 mg via ORAL
  Filled 2024-06-28 (×3): qty 1
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
## 2024-06-28 NOTE — Progress Notes (Signed)
 Patient is stable to transfer out of ICU per PCCM team. IMTS will assume care of patient on 7/4 at 7AM.   Hadassah Kristy Ahr, MD [facility] Internal Medicine Program - PGY-3 06/28/2024, 1:57 PM
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
## 2024-06-28 NOTE — Progress Notes (Signed)
 Patient transfer to 4E room 11, without any complications all belongings place at bedside.
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
## 2024-06-29 LAB — CBC
HCT: 30.4 % — ABNORMAL LOW (ref 39.0–52.0)
Hemoglobin: 10 g/dL — ABNORMAL LOW (ref 13.0–17.0)
MCH: 30.5 pg (ref 26.0–34.0)
MCHC: 32.9 g/dL (ref 30.0–36.0)
MCV: 92.7 fL (ref 80.0–100.0)
Platelets: 94 K/uL — ABNORMAL LOW (ref 150–400)
RBC: 3.28 MIL/uL — ABNORMAL LOW (ref 4.22–5.81)
RDW: 14.2 % (ref 11.5–15.5)
WBC: 6.1 K/uL (ref 4.0–10.5)
nRBC: 0 % (ref 0.0–0.2)

## 2024-06-29 LAB — COMPREHENSIVE METABOLIC PANEL WITH GFR
ALT: 30 U/L (ref 0–44)
AST: 26 U/L (ref 15–41)
Albumin: 3.4 g/dL — ABNORMAL LOW (ref 3.5–5.0)
Alkaline Phosphatase: 46 U/L (ref 38–126)
Anion gap: 5 (ref 5–15)
BUN: 12 mg/dL (ref 8–23)
CO2: 28 mmol/L (ref 22–32)
Calcium: 8.5 mg/dL — ABNORMAL LOW (ref 8.9–10.3)
Chloride: 102 mmol/L (ref 98–111)
Creatinine, Ser: 0.71 mg/dL (ref 0.61–1.24)
GFR, Estimated: >60 mL/min (ref >60)
Glucose, Bld: 86 mg/dL (ref 70–99)
Potassium: 3.8 mmol/L (ref 3.5–5.1)
Sodium: 135 mmol/L (ref 135–145)
Total Bilirubin: 1 mg/dL (ref 0.0–1.2)
Total Protein: 5.3 g/dL — ABNORMAL LOW (ref 6.5–8.1)

## 2024-06-29 LAB — RAPID URINE DRUG SCREEN, HOSP PERFORMED
Amphetamines: NOT DETECTED
Barbiturates: NOT DETECTED
Benzodiazepines: NOT DETECTED
Cocaine: NOT DETECTED
Opiates: NOT DETECTED
Tetrahydrocannabinol: NOT DETECTED

## 2024-06-29 MED ORDER — FERROUS SULFATE 325 (65 FE) MG PO TABS
325.0000 mg | ORAL_TABLET | Freq: Every day | ORAL | Status: AC
Start: 2024-06-30 — End: ?

## 2024-06-29 MED ORDER — POLYETHYLENE GLYCOL 3350 17 G PO PACK: 17.0000 g | PACK | Freq: Every day | ORAL | Status: AC

## 2024-06-29 MED ORDER — VITAMIN B-1 100 MG PO TABS: 100.0000 mg | ORAL_TABLET | Freq: Every day | ORAL | Status: AC

## 2024-06-29 MED ORDER — FOLIC ACID 1 MG PO TABS
1.0000 mg | ORAL_TABLET | Freq: Every day | ORAL | Status: AC
Start: 2024-06-30 — End: ?

## 2024-06-29 MED ORDER — CLONAZEPAM 1 MG PO TABS
0.5000 mg | ORAL_TABLET | Freq: Two times a day (BID) | ORAL | Status: AC
Start: 2024-06-29 — End: ?
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
## 2024-06-29 NOTE — Progress Notes (Signed)
   06/29/24 [id number]  Provider Notification  Provider Name/Title PCCM on-call consult team  Date Provider Notified 06/29/24  Time Provider Notified [phone]  Method of Notification Page  Notification Reason Change in status. Pt has been agitated, restless, getting in and out of bed frequently, pulled the heart monitor and IV out and wandering around naked in his room. Pt requires a 1:1 Recruitment consultant.   Provider response Evaluated remotely. No new order received. We got informed that the Pt will transfer under the care of the hospitalist team at 7 am.   Pt is stable hemodynamically, afebrile, HR is in 40s, SB on the monitor before he pulled the heart monitor out. No acute distress.  Pt is redirectable, able to follow commands. At this moment we are going to wait for a hospitalist team onboard for further treatment plan.   Date of Provider Response 06/29/24  Time of Provider Response [id number]   Wendi Dash, RN
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
## 2024-06-29 NOTE — NC FL2 (Signed)
 [place]  MEDICAID FL2 LEVEL OF CARE FORM     IDENTIFICATION  Patient Name: [name] Birthdate: [birth year] Sex: male Admission Date (Current Location): 06/26/2024  [facility] [place] Number:  Producer, television/film/video and Address:  [facility], [address], [place], [place] [address]      Provider Number: [id number]  Attending Physician Name and Address:  Francesco Elsie NOVAK, MD  Relative Name and Phone Number:       Current Level of Care: Other (Comment) Recommended Level of Care: Assisted Living Facility, Memory Care Prior Approval Number:    Date Approved/Denied:   PASRR Number:    Discharge Plan: Other (Comment) (ALF)    Current Diagnoses: Patient Active Problem List   Diagnosis Date Noted   Glasgow coma scale total score 9-12 06/27/2024   Hypothermia 06/27/2024   Encephalopathy 06/26/2024   Hypothermia due to non-environmental cause 06/26/2024   Sepsis rule out 06/26/2024   Bradycardia 06/26/2024   Polypharmacy 06/26/2024   Expressive aphasia 06/26/2024   Communication impairment 06/26/2024   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 06/26/2024   TBI (traumatic brain injury) (HCC) 02/05/2018   Influenza A 02/05/2018    Orientation RESPIRATION BLADDER Height & Weight        Normal Incontinent Weight: 160 lb (72.6 kg) Height:  5' 9 (175.3 cm)  BEHAVIORAL SYMPTOMS/MOOD NEUROLOGICAL BOWEL NUTRITION STATUS      Incontinent Diet (dysphagia 3 thin diet)  AMBULATORY STATUS COMMUNICATION OF NEEDS Skin   Supervision Non-Verbally                         Personal Care Assistance Level of Assistance  Bathing, Feeding, Dressing Bathing Assistance: Limited assistance Feeding assistance: Independent Dressing Assistance: Limited assistance     Functional Limitations Info  Sight, Hearing, Speech Sight Info: Adequate Hearing Info: Adequate Speech Info: Adequate    SPECIAL CARE FACTORS FREQUENCY                        Contractures Contractures Info: Not present    Additional Factors Info  Code Status Code Status Info: DNR Limited             Current Medications (06/29/2024):  This is the current hospital active medication list Current Facility-Administered Medications  Medication Dose Route Frequency Provider Last Rate Last Admin   atropine  1 MG/10ML injection 0.5 mg  0.5 mg Intravenous Q5 min PRN Francesco Elsie NOVAK, MD   0.5 mg at 06/27/24 0544   Chlorhexidine  Gluconate Cloth 2 % PADS 6 each  6 each Topical Daily Maree Harder, MD   6 each at 06/28/24 1213   clonazePAM  (KLONOPIN ) tablet 0.5 mg  0.5 mg Oral BID Gomez-Caraballo, Maria, MD   0.5 mg at 06/29/24 0849   docusate sodium  (COLACE) capsule 100 mg  100 mg Oral Daily Smith, Joshua C, NP   100 mg at 06/29/24 0849   enoxaparin  (LOVENOX ) injection 40 mg  40 mg Subcutaneous Q2200 Winfrey, William B, MD   40 mg at 06/28/24 2302   feeding supplement (ENSURE PLUS HIGH PROTEIN) liquid 237 mL  237 mL Oral BID BM Kara Dorn NOVAK, MD   237 mL at 06/29/24 1405   ferrous sulfate  tablet 325 mg  325 mg Oral Q breakfast Claudene Toribio BROCKS, MD   325 mg at 06/29/24 0849   folic acid  (FOLVITE ) tablet 1 mg  1 mg Oral Daily Wilson,  Powell ORN, RPH   1 mg at 06/29/24 9150   Oral care mouth rinse  15 mL Mouth Rinse PRN Maree Harder, MD       PARoxetine  (PAXIL ) tablet 30 mg  30 mg Oral Daily Gomez-Caraballo, Maria, MD   30 mg at 06/29/24 0849   polyethylene glycol (MIRALAX  / GLYCOLAX ) packet 17 g  17 g Oral Daily Smith, Joshua C, NP   17 g at 06/29/24 9149   thiamine  (VITAMIN B1) tablet 100 mg  100 mg Oral Daily Tanda Powell ORN, RPH   100 mg at 06/29/24 9150     Discharge Medications: clonazePAM  1 MG tablet Commonly known as: KLONOPIN  Take 0.5 tablets (0.5 mg total) by mouth 2 (two) times daily. What changed: how much to take    ferrous sulfate  325 (65 FE) MG tablet Take 1 tablet (325 mg total) by mouth daily with breakfast. Start taking on: June 30, 2024    folic acid  1 MG tablet Commonly known as: FOLVITE  Take 1 tablet (1 mg total) by mouth daily. Start taking on: June 30, 2024    PARoxetine  30 MG tablet Commonly known as: PAXIL  Take 30 mg by mouth daily.    polyethylene glycol 17 g packet Commonly known as: MIRALAX  / GLYCOLAX  Take 17 g by mouth daily. Start taking on: June 30, 2024    thiamine  100 MG tablet Commonly known as: Vitamin B-1 Take 1 tablet (100 mg total) by mouth daily. Start taking on: June 30, 2024           Relevant Imaging Results:  Relevant Lab Results:   Additional Information SSN # [id number]  Montie LOISE Louder, LCSW

## 2024-06-29 NOTE — Plan of Care (Signed)
  Problem: Clinical Measurements: Goal: Ability to maintain clinical measurements within normal limits will improve Outcome: Progressing   Problem: Nutrition: Goal: Adequate nutrition will be maintained Outcome: Progressing   Problem: Activity: Goal: Risk for activity intolerance will decrease Outcome: Progressing   Problem: Safety: Goal: Ability to remain free from injury will improve Outcome: Progressing   Problem: Skin Integrity: Goal: Risk for impaired skin integrity will decrease Outcome: Progressing

## 2024-06-29 NOTE — Progress Notes (Signed)
 Mobility Specialist Progress Note:    06/29/24 1450  Mobility  Activity Ambulated independently in hallway;Ambulated independently in room  Level of Assistance Independent  Distance Ambulated (ft) 960 ft  Activity Response Tolerated well  Mobility Referral Yes  Mobility visit 1 Mobility  Mobility Specialist Start Time (ACUTE ONLY) 1450  Mobility Specialist Stop Time (ACUTE ONLY) 1505  Mobility Specialist Time Calculation (min) (ACUTE ONLY) 15 min   Pt agreeable to mobility session. Ambulated independently, no AD required. Tolerated well, asx throughout. Returned pt to room, sitter in room. All needs met.   Keelen Quevedo Mobility Specialist Please contact via Special educational needs teacher or  Rehab office at [phone]
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
## 2024-06-29 NOTE — TOC Progression Note (Signed)
 Transition of Care [facility]) - Progression Note    Patient Details  Name: [name] MRN: [id number] Date of Birth: 04-24-1961  [facility] CM/SW Contact  Montie LOISE Louder, [place] Phone Number: 06/29/2024, 3:03 PM  Clinical Narrative:    2:42 x2- called ALF  no answer   3:00 pm- [facility] @ [phone], advised patient is expected to d/c tomorrow. CSW informed they can not accept patient back until Monday due to staffing. CSW informed, our leadership has been informed and will need to speak with their leadership about patient discharging on Monday when per MD stable tomorrow.   3:10- received call back from [place] requested to speak with TOC Sup/ Erminio, provided contact information.   Montie Louder, MSW, LCSW Clinical Social Worker           Expected Discharge Plan and Services                                               Social Determinants of Health (SDOH) Interventions SDOH Screenings   Food Insecurity: No Food Insecurity (06/26/2024)  Tobacco Use: Low Risk  (06/26/2024)    Readmission Risk Interventions     No data to display
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
## 2024-06-30 DIAGNOSIS — F0390 Unspecified dementia without behavioral disturbance: Secondary | ICD-10-CM | POA: Insufficient documentation

## 2024-06-30 LAB — IRON AND TIBC
Iron: 52 ug/dL (ref 45–182)
Saturation Ratios: 20 % (ref 17.9–39.5)
TIBC: 259 ug/dL (ref 250–450)
UIBC: 207 ug/dL

## 2024-06-30 LAB — BASIC METABOLIC PANEL WITH GFR
Anion gap: 6 (ref 5–15)
BUN: 19 mg/dL (ref 8–23)
CO2: 28 mmol/L (ref 22–32)
Calcium: 8.9 mg/dL (ref 8.9–10.3)
Chloride: 101 mmol/L (ref 98–111)
Creatinine, Ser: 0.86 mg/dL (ref 0.61–1.24)
GFR, Estimated: >60 mL/min (ref >60)
Glucose, Bld: 86 mg/dL (ref 70–99)
Potassium: 4 mmol/L (ref 3.5–5.1)
Sodium: 135 mmol/L (ref 135–145)

## 2024-06-30 LAB — CBC
HCT: 30.2 % — ABNORMAL LOW (ref 39.0–52.0)
Hemoglobin: 10 g/dL — ABNORMAL LOW (ref 13.0–17.0)
MCH: 30.8 pg (ref 26.0–34.0)
MCHC: 33.1 g/dL (ref 30.0–36.0)
MCV: 92.9 fL (ref 80.0–100.0)
Platelets: 101 K/uL — ABNORMAL LOW (ref 150–400)
RBC: 3.25 MIL/uL — ABNORMAL LOW (ref 4.22–5.81)
RDW: 14.5 % (ref 11.5–15.5)
WBC: 6.1 K/uL (ref 4.0–10.5)
nRBC: 0 % (ref 0.0–0.2)

## 2024-06-30 LAB — FERRITIN: Ferritin: 150 ng/mL (ref 24–336)

## 2024-06-30 LAB — VITAMIN B12: Vitamin B-12: 775 pg/mL (ref 180–914)

## 2024-06-30 LAB — RETICULOCYTES
Immature Retic Fract: 10.9 % (ref 2.3–15.9)
RBC.: 3.23 MIL/uL — ABNORMAL LOW (ref 4.22–5.81)
Retic Count, Absolute: 27.8 K/uL (ref 19.0–186.0)
Retic Ct Pct: 0.9 % (ref 0.4–3.1)

## 2024-06-30 LAB — FOLATE: Folate: 14.1 ng/mL (ref >5.9)

## 2024-06-30 NOTE — TOC Transition Note (Signed)
 Transition of Care [facility]) - Discharge Note   Patient Details  Name: [name] MRN: [id number] Date of Birth: 08-20-1961  [facility] CM/SW Contact:  Olam FORBES Ally, LCSW Phone Number: 06/30/2024, 12:22 PM   Clinical Narrative:     Patient will DC to:?[facility] Anticipated DC date:?06/30/2024 Family notified:?[facility] by: ROME   Per MD patient ready for DC to [facility] RN, patient, patient's family, and facility notified of DC. Discharge Summary sent to facility, facility requested specific information be added to fl2. RN given number for report [phone].  DC packet on chart. Ambulance transport requested for patient.   CSW signing off.   Olam Ally, [place] [phone]     Barriers to Discharge: Continued Medical Work up   Patient Goals and CMS Choice            Discharge Placement                       Discharge Plan and Services Additional resources added to the After Visit Summary for   In-house Referral: Clinical Social Work                                   Social Drivers of Health (SDOH) Interventions SDOH Screenings   Food Insecurity: No Food Insecurity (06/26/2024)  Tobacco Use: Low Risk  (06/26/2024)     Readmission Risk Interventions     No data to display

## 2024-06-30 NOTE — TOC Transition Note (Deleted)
 Transition of Care [facility]) - Discharge Note   Patient Details  Name: [name] MRN: [id number] Date of Birth: 05-30-1961  [facility] CM/SW Contact:  Olam FORBES Ally, LCSW Phone Number: 06/30/2024, 11:57 AM   Clinical Narrative:     Patient will DC to:?[facility] Anticipated DC date:06/30/2024? Family notified:?[facility] by: ROME   Per MD patient ready for DC to [facility]. RN, patient, patient's family, and facility notified of DC. Discharge Summary sent to facility. CSW was asked to change the fl2 by facility to include specific information.RN given number for report  [phone]. DC packet on chart. Ambulance transport requested for patient.   CSW signing off.   Olam Ally, [place] [phone]     Barriers to Discharge: Continued Medical Work up   Patient Goals and CMS Choice            Discharge Placement                       Discharge Plan and Services Additional resources added to the After Visit Summary for   In-house Referral: Clinical Social Work                                   Social Drivers of Health (SDOH) Interventions SDOH Screenings   Food Insecurity: No Food Insecurity (06/26/2024)  Tobacco Use: Low Risk  (06/26/2024)     Readmission Risk Interventions     No data to display

## 2024-06-30 NOTE — NC FL2 (Addendum)
 [place]  MEDICAID [id number] LEVEL OF CARE FORM     IDENTIFICATION  Patient Name: [name] Birthdate: Jan 11, 1961 Sex: male Admission Date (Current Location): 06/26/2024  [facility] and [place] Number:  Producer, television/film/video and Address:  The [place], [address], [place], [place] [address]      Provider Number: [id number]  Attending Physician Name and Address:  Francesco Elsie NOVAK, MD  Relative Name and Phone Number:       Current Level of Care: Other (Comment) (Memory Care) Recommended Level of Care: Memory Care Prior Approval Number:    Date Approved/Denied:   PASRR Number:    Discharge Plan: Other (Comment) (Memory care)    Current Diagnoses: Patient Active Problem List   Diagnosis Date Noted   Dementia (HCC) 06/30/2024   Glasgow coma scale total score 9-12 06/27/2024   Hypothermia 06/27/2024   Encephalopathy 06/26/2024   Hypothermia due to non-environmental cause 06/26/2024   Sepsis rule out 06/26/2024   Bradycardia 06/26/2024   Polypharmacy 06/26/2024   Expressive aphasia 06/26/2024   Communication impairment 06/26/2024   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 06/26/2024   TBI (traumatic brain injury) (HCC) 02/05/2018   Influenza A 02/05/2018    Orientation RESPIRATION BLADDER Height & Weight        Normal Incontinent Weight: 160 lb (72.6 kg) Height:  5' 9 (175.3 cm)  BEHAVIORAL SYMPTOMS/MOOD NEUROLOGICAL BOWEL NUTRITION STATUS      Incontinent Diet (dysphagia 3 thin diet), ok for salt  AMBULATORY STATUS COMMUNICATION OF NEEDS Skin   Supervision Non-Verbally                         Personal Care Assistance Level of Assistance  Bathing, Feeding, Dressing Bathing Assistance: Limited assistance Feeding assistance: Independent Dressing Assistance: Limited assistance     Functional Limitations Info  Sight, Hearing, Speech Sight Info: Adequate Hearing Info: Adequate Speech Info: Adequate    SPECIAL CARE  FACTORS FREQUENCY                       Contractures Contractures Info: Not present    Additional Factors Info  Code Status Code Status Info: DNR Limited             Current Medications (06/30/2024):  This is the current hospital active medication list Current Facility-Administered Medications  Medication Dose Route Frequency Provider Last Rate Last Admin   atropine  1 MG/10ML injection 0.5 mg  0.5 mg Intravenous Q5 min PRN Francesco Elsie NOVAK, MD   0.5 mg at 06/27/24 0544   Chlorhexidine  Gluconate Cloth 2 % PADS 6 each  6 each Topical Daily Maree Harder, MD   6 each at 06/28/24 1213   clonazePAM  (KLONOPIN ) tablet 0.5 mg  0.5 mg Oral BID Gomez-Caraballo, Maria, MD   0.5 mg at 06/30/24 9191   docusate sodium  (COLACE) capsule 100 mg  100 mg Oral Daily Smith, Joshua C, NP   100 mg at 06/30/24 9191   enoxaparin  (LOVENOX ) injection 40 mg  40 mg Subcutaneous Q2200 Winfrey, William B, MD   40 mg at 06/29/24 2118   feeding supplement (ENSURE PLUS HIGH PROTEIN) liquid 237 mL  237 mL Oral BID BM Kara Dorn NOVAK, MD   237 mL at 06/30/24 0809   ferrous sulfate  tablet 325 mg  325 mg Oral Q breakfast Claudene Toribio BROCKS, MD   325 mg at 06/30/24 9191   folic acid  (FOLVITE ) tablet  1 mg  1 mg Oral Daily Tanda Powell ORN, RPH   1 mg at 06/30/24 9191   Oral care mouth rinse  15 mL Mouth Rinse PRN Maree Harder, MD       PARoxetine  (PAXIL ) tablet 30 mg  30 mg Oral Daily Gomez-Caraballo, Maria, MD   30 mg at 06/30/24 0808   polyethylene glycol (MIRALAX  / GLYCOLAX ) packet 17 g  17 g Oral Daily Smith, Joshua C, NP   17 g at 06/29/24 0850   thiamine  (VITAMIN B1) tablet 100 mg  100 mg Oral Daily Tanda Powell ORN, RPH   100 mg at 06/30/24 9191     Discharge Medications: Please see discharge summary for a list of discharge medications.  Relevant Imaging Results:  Relevant Lab Results:   Additional Information SSN # [id number]  Olam FORBES Ally, LCSW

## 2024-06-30 NOTE — Plan of Care (Signed)
  Problem: Clinical Measurements: Goal: Diagnostic test results will improve Outcome: Progressing   Problem: Nutrition: Goal: Adequate nutrition will be maintained Outcome: Progressing   Problem: Safety: Goal: Ability to remain free from injury will improve Outcome: Progressing   Problem: Skin Integrity: Goal: Risk for impaired skin integrity will decrease Outcome: Progressing   Problem: Health Behavior/Discharge Planning: Goal: Ability to manage health-related needs will improve Outcome: Progressing

## 2024-06-30 NOTE — Progress Notes (Signed)
 Discharge order obtained, hand off report called a RN of the facility, AVS documents placed in discharge packet, PIV removed, patient had no any belongings at bedside, CSW has already called ambulance, waiting for the transport.

## 2024-06-30 NOTE — NC FL2 (Addendum)
 [place]  MEDICAID [id number] LEVEL OF CARE FORM     IDENTIFICATION  Patient Name: [name] Birthdate: 10-23-1961 Sex: male Admission Date (Current Location): 06/26/2024  [facility] and [place] Number:  Producer, television/film/video and Address:  The [place], [address], [place], [place] [address]      Provider Number: [id number]  Attending Physician Name and Address:  Francesco Elsie NOVAK, MD  Relative Name and Phone Number:       Current Level of Care: Other (Comment) (Memory Care) Recommended Level of Care: Memory Care Prior Approval Number:    Date Approved/Denied:   PASRR Number:    Discharge Plan: Other (Comment) (Memory care)    Current Diagnoses: Patient Active Problem List   Diagnosis Date Noted   Cognitive impairment 06/30/2024   Glasgow coma scale total score 9-12 06/27/2024   Hypothermia 06/27/2024   Encephalopathy 06/26/2024   Hypothermia due to non-environmental cause 06/26/2024   Sepsis rule out 06/26/2024   Bradycardia 06/26/2024   Polypharmacy 06/26/2024   Expressive aphasia 06/26/2024   Communication impairment 06/26/2024   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 06/26/2024   TBI (traumatic brain injury) (HCC) 02/05/2018   Influenza A 02/05/2018    Orientation RESPIRATION BLADDER Height & Weight        Normal Incontinent Weight: 160 lb (72.6 kg) Height:  5' 9 (175.3 cm)  BEHAVIORAL SYMPTOMS/MOOD NEUROLOGICAL BOWEL NUTRITION STATUS      Incontinent Diet (dysphagia 3 thin diet) Regular salt  AMBULATORY STATUS COMMUNICATION OF NEEDS Skin   Supervision Non-Verbally                         Personal Care Assistance Level of Assistance  Bathing, Feeding, Dressing Bathing Assistance: Limited assistance Feeding assistance: Independent Dressing Assistance: Limited assistance     Functional Limitations Info  Sight, Hearing, Speech Sight Info: Adequate Hearing Info: Adequate Speech Info: Adequate    SPECIAL  CARE FACTORS FREQUENCY                       Contractures Contractures Info: Not present    Additional Factors Info  Code Status Code Status Info: DNR Limited             Current Medications (06/30/2024):  This is the current hospital active medication list Current Facility-Administered Medications  Medication Dose Route Frequency Provider Last Rate Last Admin   atropine  1 MG/10ML injection 0.5 mg  0.5 mg Intravenous Q5 min PRN Francesco Elsie NOVAK, MD   0.5 mg at 06/27/24 0544   Chlorhexidine  Gluconate Cloth 2 % PADS 6 each  6 each Topical Daily Maree Harder, MD   6 each at 06/28/24 1213   clonazePAM  (KLONOPIN ) tablet 0.5 mg  0.5 mg Oral BID Gomez-Caraballo, Maria, MD   0.5 mg at 06/30/24 9191   docusate sodium  (COLACE) capsule 100 mg  100 mg Oral Daily Smith, Joshua C, NP   100 mg at 06/30/24 9191   enoxaparin  (LOVENOX ) injection 40 mg  40 mg Subcutaneous Q2200 Winfrey, William B, MD   40 mg at 06/29/24 2118   feeding supplement (ENSURE PLUS HIGH PROTEIN) liquid 237 mL  237 mL Oral BID BM Kara Dorn NOVAK, MD   237 mL at 06/30/24 0809   ferrous sulfate  tablet 325 mg  325 mg Oral Q breakfast Claudene Toribio BROCKS, MD   325 mg at 06/30/24 9191   folic acid  (FOLVITE ) tablet 1  mg  1 mg Oral Daily Tanda Powell ORN, RPH   1 mg at 06/30/24 9191   Oral care mouth rinse  15 mL Mouth Rinse PRN Maree Harder, MD       PARoxetine  (PAXIL ) tablet 30 mg  30 mg Oral Daily Gomez-Caraballo, Maria, MD   30 mg at 06/30/24 9191   polyethylene glycol (MIRALAX  / GLYCOLAX ) packet 17 g  17 g Oral Daily Smith, Joshua C, NP   17 g at 06/29/24 9149   thiamine  (VITAMIN B1) tablet 100 mg  100 mg Oral Daily Tanda Powell ORN, RPH   100 mg at 06/30/24 9191     Discharge Medications: Please see discharge summary for a list of discharge medications.  Relevant Imaging Results:  Relevant Lab Results:   Additional Information SSN # [id number]  Olam FORBES Ally, LCSW

## 2024-06-30 NOTE — Assessment & Plan Note (Signed)
 Per Camelia Hsu, RN manager at [facility], patient has carried diagnosis for 11 years.

## 2024-07-01 LAB — CULTURE, BLOOD (ROUTINE X 2)
Culture: NO GROWTH
Culture: NO GROWTH
Special Requests: ADEQUATE
Special Requests: ADEQUATE

## 2024-10-26 ENCOUNTER — Other Ambulatory Visit: Payer: Self-pay

## 2024-10-26 ENCOUNTER — Encounter (HOSPITAL_COMMUNITY): Payer: Self-pay | Admitting: Emergency Medicine

## 2024-10-26 ENCOUNTER — Emergency Department (HOSPITAL_COMMUNITY)
Admission: EM | Admit: 2024-10-26 | Discharge: 2024-10-26 | Disposition: A | Discharge status: Home / Self Care | Payer: MEDICAID | Attending: Emergency Medicine | Admitting: Emergency Medicine

## 2024-10-26 DIAGNOSIS — R7309 Other abnormal glucose: Secondary | ICD-10-CM | POA: Diagnosis not present

## 2024-10-26 DIAGNOSIS — R410 Disorientation, unspecified: Secondary | ICD-10-CM | POA: Insufficient documentation

## 2024-10-26 LAB — COMPREHENSIVE METABOLIC PANEL WITH GFR
ALT: 7 U/L (ref 0–44)
AST: 12 U/L — ABNORMAL LOW (ref 15–41)
Albumin: 3.2 g/dL — ABNORMAL LOW (ref 3.5–5.0)
Alkaline Phosphatase: 69 U/L (ref 38–126)
Anion gap: 5 (ref 5–15)
BUN: 18 mg/dL (ref 8–23)
CO2: 25 mmol/L (ref 22–32)
Calcium: 6.9 mg/dL — ABNORMAL LOW (ref 8.9–10.3)
Chloride: 114 mmol/L — ABNORMAL HIGH (ref 98–111)
Creatinine, Ser: 0.66 mg/dL (ref 0.61–1.24)
GFR, Estimated: >60 mL/min (ref >60)
Glucose, Bld: 92 mg/dL (ref 70–99)
Potassium: 3.5 mmol/L (ref 3.5–5.1)
Sodium: 143 mmol/L (ref 135–145)
Total Bilirubin: 0.2 mg/dL (ref 0.0–1.2)
Total Protein: 5 g/dL — ABNORMAL LOW (ref 6.5–8.1)

## 2024-10-26 LAB — CBC WITH DIFFERENTIAL/PLATELET
Abs Immature Granulocytes: 0.01 K/uL (ref 0.00–0.07)
Basophils Absolute: 0 K/uL (ref 0.0–0.1)
Basophils Relative: 0 %
Eosinophils Absolute: 0.2 K/uL (ref 0.0–0.5)
Eosinophils Relative: 3 %
HCT: 34.5 % — ABNORMAL LOW (ref 39.0–52.0)
Hemoglobin: 10.6 g/dL — ABNORMAL LOW (ref 13.0–17.0)
Immature Granulocytes: 0 %
Lymphocytes Relative: 31 %
Lymphs Abs: 2.2 K/uL (ref 0.7–4.0)
MCH: 30 pg (ref 26.0–34.0)
MCHC: 30.7 g/dL (ref 30.0–36.0)
MCV: 97.7 fL (ref 80.0–100.0)
Monocytes Absolute: 0.4 K/uL (ref 0.1–1.0)
Monocytes Relative: 6 %
Neutro Abs: 4.3 K/uL (ref 1.7–7.7)
Neutrophils Relative %: 60 %
Platelets: 112 K/uL — ABNORMAL LOW (ref 150–400)
RBC: 3.53 MIL/uL — ABNORMAL LOW (ref 4.22–5.81)
RDW: 13 % (ref 11.5–15.5)
WBC: 7.2 K/uL (ref 4.0–10.5)
nRBC: 0 % (ref 0.0–0.2)

## 2024-10-26 LAB — URINALYSIS, ROUTINE W REFLEX MICROSCOPIC
Bilirubin Urine: NEGATIVE
Glucose, UA: NEGATIVE mg/dL
Hgb urine dipstick: NEGATIVE
Ketones, ur: NEGATIVE mg/dL
Leukocytes,Ua: NEGATIVE
Nitrite: NEGATIVE
Protein, ur: NEGATIVE mg/dL
Specific Gravity, Urine: 1.023 (ref 1.005–1.030)
pH: 5 (ref 5.0–8.0)

## 2024-10-26 LAB — CBG MONITORING, ED: Glucose-Capillary: 125 mg/dL — ABNORMAL HIGH (ref 70–99)

## 2024-10-26 MED ORDER — SODIUM CHLORIDE 0.9 % IV BOLUS
1000.0000 mL | Freq: Once | INTRAVENOUS | Status: AC
  Administered 2024-10-26: 1000 mL via INTRAVENOUS
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
## 2024-10-26 NOTE — Discharge Instructions (Signed)
 Follow up as needed
